# Patient Record
Sex: Female | Born: 1970
Health system: Southern US, Community
[De-identification: ages and names within clinical notes are randomized; demographics above are authoritative.]

---

## 1998-06-21 ENCOUNTER — Observation Stay (HOSPITAL_COMMUNITY): Admission: EM | Admit: 1998-06-21 | Discharge: 1998-06-22 | Payer: Self-pay | Admitting: Emergency Medicine

## 1999-03-26 ENCOUNTER — Emergency Department (HOSPITAL_COMMUNITY): Admission: EM | Admit: 1999-03-26 | Discharge: 1999-03-26 | Payer: Self-pay | Admitting: Emergency Medicine

## 1999-03-26 ENCOUNTER — Encounter: Payer: Self-pay | Admitting: Emergency Medicine

## 1999-03-31 ENCOUNTER — Encounter: Payer: Self-pay | Admitting: Orthopedic Surgery

## 1999-03-31 ENCOUNTER — Ambulatory Visit (HOSPITAL_COMMUNITY): Admission: RE | Admit: 1999-03-31 | Discharge: 1999-03-31 | Payer: Self-pay | Admitting: Orthopedic Surgery

## 2002-11-16 ENCOUNTER — Emergency Department (HOSPITAL_COMMUNITY): Admission: EM | Admit: 2002-11-16 | Discharge: 2002-11-16 | Payer: Self-pay | Admitting: Emergency Medicine

## 2003-09-26 ENCOUNTER — Emergency Department (HOSPITAL_COMMUNITY): Admission: AD | Admit: 2003-09-26 | Discharge: 2003-09-26 | Payer: Self-pay | Admitting: Family Medicine

## 2004-01-01 ENCOUNTER — Emergency Department (HOSPITAL_COMMUNITY): Admission: AD | Admit: 2004-01-01 | Discharge: 2004-01-01 | Payer: Self-pay | Admitting: Family Medicine

## 2004-01-05 ENCOUNTER — Ambulatory Visit (HOSPITAL_COMMUNITY): Admission: RE | Admit: 2004-01-05 | Discharge: 2004-01-05 | Payer: Self-pay | Admitting: Chiropractic Medicine

## 2004-05-26 ENCOUNTER — Emergency Department (HOSPITAL_COMMUNITY): Admission: EM | Admit: 2004-05-26 | Discharge: 2004-05-26 | Payer: Self-pay | Admitting: Family Medicine

## 2005-03-20 ENCOUNTER — Other Ambulatory Visit: Admission: RE | Admit: 2005-03-20 | Discharge: 2005-03-20 | Payer: Self-pay | Admitting: Obstetrics and Gynecology

## 2006-08-29 ENCOUNTER — Emergency Department (HOSPITAL_COMMUNITY): Admission: EM | Admit: 2006-08-29 | Discharge: 2006-08-29 | Payer: Self-pay | Admitting: Emergency Medicine

## 2008-04-20 ENCOUNTER — Emergency Department (HOSPITAL_COMMUNITY): Admission: EM | Admit: 2008-04-20 | Discharge: 2008-04-20 | Payer: Self-pay | Admitting: Family Medicine

## 2008-04-23 ENCOUNTER — Ambulatory Visit: Payer: Self-pay | Admitting: Family Medicine

## 2009-04-07 ENCOUNTER — Emergency Department (HOSPITAL_COMMUNITY): Admission: EM | Admit: 2009-04-07 | Discharge: 2009-04-07 | Payer: Self-pay | Admitting: Emergency Medicine

## 2009-12-08 ENCOUNTER — Ambulatory Visit (HOSPITAL_COMMUNITY): Admission: RE | Admit: 2009-12-08 | Discharge: 2009-12-08 | Payer: Self-pay | Admitting: Obstetrics & Gynecology

## 2010-10-02 ENCOUNTER — Emergency Department (HOSPITAL_COMMUNITY)
Admission: EM | Admit: 2010-10-02 | Discharge: 2010-10-02 | Payer: Self-pay | Source: Home / Self Care | Admitting: Family Medicine

## 2010-11-01 ENCOUNTER — Emergency Department (HOSPITAL_COMMUNITY)
Admission: EM | Admit: 2010-11-01 | Discharge: 2010-11-01 | Payer: Self-pay | Source: Home / Self Care | Admitting: Emergency Medicine

## 2010-11-19 ENCOUNTER — Encounter: Payer: Self-pay | Admitting: Obstetrics & Gynecology

## 2011-01-19 ENCOUNTER — Other Ambulatory Visit (HOSPITAL_COMMUNITY): Payer: Self-pay | Admitting: Obstetrics & Gynecology

## 2011-01-19 DIAGNOSIS — Z1231 Encounter for screening mammogram for malignant neoplasm of breast: Secondary | ICD-10-CM

## 2011-01-25 ENCOUNTER — Ambulatory Visit (HOSPITAL_COMMUNITY)
Admission: RE | Admit: 2011-01-25 | Discharge: 2011-01-25 | Disposition: A | Discharge status: Home / Self Care | Payer: Commercial Managed Care - PPO | Source: Ambulatory Visit | Attending: Obstetrics & Gynecology | Admitting: Obstetrics & Gynecology

## 2011-01-25 DIAGNOSIS — Z1231 Encounter for screening mammogram for malignant neoplasm of breast: Secondary | ICD-10-CM

## 2011-02-05 LAB — POCT I-STAT, CHEM 8
Calcium, Ion: 1.09 mmol/L — ABNORMAL LOW (ref 1.12–1.32)
Chloride: 105 mEq/L (ref 96–112)
Glucose, Bld: 80 mg/dL (ref 70–99)
HCT: 41 % (ref 36.0–46.0)
Hemoglobin: 13.9 g/dL (ref 12.0–15.0)
TCO2: 21 mmol/L (ref 0–100)

## 2011-02-15 ENCOUNTER — Inpatient Hospital Stay (INDEPENDENT_AMBULATORY_CARE_PROVIDER_SITE_OTHER)
Admission: RE | Admit: 2011-02-15 | Discharge: 2011-02-15 | Disposition: A | Discharge status: Home / Self Care | Payer: Commercial Managed Care - PPO | Source: Ambulatory Visit

## 2011-02-15 ENCOUNTER — Ambulatory Visit (INDEPENDENT_AMBULATORY_CARE_PROVIDER_SITE_OTHER): Payer: Commercial Managed Care - PPO

## 2011-02-15 DIAGNOSIS — R071 Chest pain on breathing: Secondary | ICD-10-CM

## 2011-02-15 DIAGNOSIS — K219 Gastro-esophageal reflux disease without esophagitis: Secondary | ICD-10-CM

## 2011-02-15 LAB — POCT I-STAT, CHEM 8
BUN: 11 mg/dL (ref 6–23)
Calcium, Ion: 1.22 mmol/L (ref 1.12–1.32)
Hemoglobin: 15.3 g/dL — ABNORMAL HIGH (ref 12.0–15.0)
TCO2: 24 mmol/L (ref 0–100)

## 2011-02-15 LAB — DIFFERENTIAL
Basophils Relative: 1 % (ref 0–1)
Eosinophils Absolute: 0.2 10*3/uL (ref 0.0–0.7)
Monocytes Absolute: 0.5 10*3/uL (ref 0.1–1.0)
Neutrophils Relative %: 50 % (ref 43–77)

## 2011-02-15 LAB — CBC
HCT: 39.3 % (ref 36.0–46.0)
Hemoglobin: 13.2 g/dL (ref 12.0–15.0)
MCV: 67.2 fL — ABNORMAL LOW (ref 78.0–100.0)
RBC: 5.85 MIL/uL — ABNORMAL HIGH (ref 3.87–5.11)
WBC: 4.4 10*3/uL (ref 4.0–10.5)

## 2011-03-13 ENCOUNTER — Ambulatory Visit (HOSPITAL_COMMUNITY)
Admission: RE | Admit: 2011-03-13 | Discharge: 2011-03-13 | Disposition: A | Discharge status: Home / Self Care | Payer: Commercial Managed Care - PPO | Attending: Psychiatry | Admitting: Psychiatry

## 2011-03-13 DIAGNOSIS — F39 Unspecified mood [affective] disorder: Secondary | ICD-10-CM | POA: Insufficient documentation

## 2011-03-19 ENCOUNTER — Other Ambulatory Visit (HOSPITAL_COMMUNITY): Payer: 59 | Admitting: Psychiatry

## 2011-03-20 ENCOUNTER — Other Ambulatory Visit (HOSPITAL_COMMUNITY): Payer: 59 | Attending: Psychiatry | Admitting: Psychiatry

## 2011-03-20 DIAGNOSIS — F39 Unspecified mood [affective] disorder: Secondary | ICD-10-CM | POA: Insufficient documentation

## 2011-03-21 ENCOUNTER — Other Ambulatory Visit (HOSPITAL_COMMUNITY): Payer: 59 | Admitting: Psychiatry

## 2011-03-22 ENCOUNTER — Other Ambulatory Visit (HOSPITAL_COMMUNITY): Payer: 59 | Admitting: Psychiatry

## 2011-03-22 ENCOUNTER — Encounter (HOSPITAL_COMMUNITY): Payer: Commercial Managed Care - PPO | Admitting: Psychology

## 2011-03-23 ENCOUNTER — Other Ambulatory Visit (HOSPITAL_COMMUNITY): Payer: 59 | Admitting: Psychiatry

## 2011-03-27 ENCOUNTER — Other Ambulatory Visit (HOSPITAL_COMMUNITY): Payer: 59 | Admitting: Psychiatry

## 2011-03-27 ENCOUNTER — Other Ambulatory Visit (HOSPITAL_COMMUNITY): Payer: Commercial Managed Care - PPO | Attending: Psychiatry | Admitting: Psychiatry

## 2011-03-27 DIAGNOSIS — F39 Unspecified mood [affective] disorder: Secondary | ICD-10-CM | POA: Insufficient documentation

## 2011-03-28 ENCOUNTER — Other Ambulatory Visit (HOSPITAL_COMMUNITY): Payer: 59 | Admitting: Psychiatry

## 2011-03-29 ENCOUNTER — Other Ambulatory Visit (HOSPITAL_COMMUNITY): Payer: 59 | Admitting: Psychiatry

## 2011-03-30 ENCOUNTER — Other Ambulatory Visit (HOSPITAL_COMMUNITY): Payer: Commercial Managed Care - PPO | Attending: Psychiatry | Admitting: Psychiatry

## 2011-03-30 DIAGNOSIS — F319 Bipolar disorder, unspecified: Secondary | ICD-10-CM | POA: Insufficient documentation

## 2011-03-30 DIAGNOSIS — IMO0002 Reserved for concepts with insufficient information to code with codable children: Secondary | ICD-10-CM | POA: Insufficient documentation

## 2011-03-30 DIAGNOSIS — Z6379 Other stressful life events affecting family and household: Secondary | ICD-10-CM | POA: Insufficient documentation

## 2011-03-30 DIAGNOSIS — F39 Unspecified mood [affective] disorder: Secondary | ICD-10-CM | POA: Insufficient documentation

## 2011-04-02 ENCOUNTER — Other Ambulatory Visit (HOSPITAL_COMMUNITY): Payer: Commercial Managed Care - PPO | Admitting: Psychiatry

## 2011-04-03 ENCOUNTER — Other Ambulatory Visit (HOSPITAL_COMMUNITY): Payer: Commercial Managed Care - PPO | Admitting: Psychiatry

## 2011-04-04 ENCOUNTER — Other Ambulatory Visit (HOSPITAL_COMMUNITY): Payer: Commercial Managed Care - PPO | Admitting: Psychiatry

## 2011-04-05 ENCOUNTER — Other Ambulatory Visit (HOSPITAL_COMMUNITY): Payer: Commercial Managed Care - PPO | Admitting: Psychiatry

## 2011-04-06 ENCOUNTER — Other Ambulatory Visit (HOSPITAL_COMMUNITY): Payer: Commercial Managed Care - PPO | Admitting: Psychiatry

## 2011-04-09 ENCOUNTER — Other Ambulatory Visit (HOSPITAL_COMMUNITY): Payer: Commercial Managed Care - PPO | Attending: Psychiatry | Admitting: Psychiatry

## 2011-04-09 DIAGNOSIS — F319 Bipolar disorder, unspecified: Secondary | ICD-10-CM | POA: Insufficient documentation

## 2011-04-09 DIAGNOSIS — F39 Unspecified mood [affective] disorder: Secondary | ICD-10-CM | POA: Insufficient documentation

## 2011-04-09 DIAGNOSIS — Z6379 Other stressful life events affecting family and household: Secondary | ICD-10-CM | POA: Insufficient documentation

## 2011-04-09 DIAGNOSIS — IMO0002 Reserved for concepts with insufficient information to code with codable children: Secondary | ICD-10-CM | POA: Insufficient documentation

## 2011-04-10 ENCOUNTER — Other Ambulatory Visit (HOSPITAL_COMMUNITY): Payer: Commercial Managed Care - PPO | Admitting: Psychiatry

## 2011-04-11 ENCOUNTER — Other Ambulatory Visit (HOSPITAL_COMMUNITY): Payer: Commercial Managed Care - PPO | Admitting: Psychiatry

## 2011-04-12 ENCOUNTER — Other Ambulatory Visit (HOSPITAL_COMMUNITY): Payer: Commercial Managed Care - PPO | Admitting: Psychiatry

## 2011-05-24 ENCOUNTER — Inpatient Hospital Stay (INDEPENDENT_AMBULATORY_CARE_PROVIDER_SITE_OTHER)
Admission: RE | Admit: 2011-05-24 | Discharge: 2011-05-24 | Disposition: A | Discharge status: Home / Self Care | Payer: Commercial Managed Care - PPO | Source: Ambulatory Visit | Attending: Family Medicine | Admitting: Family Medicine

## 2011-05-24 DIAGNOSIS — N390 Urinary tract infection, site not specified: Secondary | ICD-10-CM

## 2011-05-24 DIAGNOSIS — I10 Essential (primary) hypertension: Secondary | ICD-10-CM

## 2011-05-24 LAB — POCT URINALYSIS DIP (DEVICE)
Bilirubin Urine: NEGATIVE
Ketones, ur: NEGATIVE mg/dL
Protein, ur: NEGATIVE mg/dL
Specific Gravity, Urine: 1.015 (ref 1.005–1.030)
pH: 7 (ref 5.0–8.0)

## 2011-07-26 LAB — POCT CARDIAC MARKERS
CKMB, poc: <1 — ABNORMAL LOW
Myoglobin, poc: 29.5
Operator id: 284251
Operator id: 284251
Troponin i, poc: <0.05

## 2011-07-26 LAB — CBC
HCT: 35.9 — ABNORMAL LOW
Hemoglobin: 11.8 — ABNORMAL LOW
MCV: 70.2 — ABNORMAL LOW
RBC: 5.12 — ABNORMAL HIGH
WBC: 8.2

## 2011-07-26 LAB — POCT I-STAT, CHEM 8
BUN: 12
Potassium: 3.6
Sodium: 138
TCO2: 22

## 2011-12-19 ENCOUNTER — Other Ambulatory Visit (HOSPITAL_COMMUNITY): Payer: Self-pay | Admitting: Obstetrics & Gynecology

## 2011-12-19 DIAGNOSIS — Z1231 Encounter for screening mammogram for malignant neoplasm of breast: Secondary | ICD-10-CM

## 2012-01-21 ENCOUNTER — Ambulatory Visit (HOSPITAL_COMMUNITY)
Admission: RE | Admit: 2012-01-21 | Discharge: 2012-01-21 | Disposition: A | Discharge status: Home / Self Care | Payer: 59 | Source: Ambulatory Visit | Attending: Obstetrics & Gynecology | Admitting: Obstetrics & Gynecology

## 2012-01-21 DIAGNOSIS — Z1231 Encounter for screening mammogram for malignant neoplasm of breast: Secondary | ICD-10-CM | POA: Insufficient documentation

## 2012-04-22 ENCOUNTER — Encounter (HOSPITAL_COMMUNITY): Payer: Self-pay

## 2012-04-22 ENCOUNTER — Emergency Department (HOSPITAL_COMMUNITY)
Admission: EM | Admit: 2012-04-22 | Discharge: 2012-04-22 | Disposition: A | Discharge status: Nursing Home (Includes ICF) | Payer: 59 | Source: Home / Self Care | Attending: Family Medicine | Admitting: Family Medicine

## 2012-04-22 ENCOUNTER — Encounter (HOSPITAL_COMMUNITY): Payer: Self-pay | Admitting: Adult Health

## 2012-04-22 DIAGNOSIS — R001 Bradycardia, unspecified: Secondary | ICD-10-CM

## 2012-04-22 DIAGNOSIS — R51 Headache: Secondary | ICD-10-CM

## 2012-04-22 DIAGNOSIS — R600 Localized edema: Secondary | ICD-10-CM

## 2012-04-22 DIAGNOSIS — R110 Nausea: Secondary | ICD-10-CM

## 2012-04-22 DIAGNOSIS — R609 Edema, unspecified: Secondary | ICD-10-CM

## 2012-04-22 DIAGNOSIS — I498 Other specified cardiac arrhythmias: Secondary | ICD-10-CM

## 2012-04-22 DIAGNOSIS — Z0389 Encounter for observation for other suspected diseases and conditions ruled out: Secondary | ICD-10-CM | POA: Insufficient documentation

## 2012-04-22 LAB — POCT URINALYSIS DIP (DEVICE)
Leukocytes, UA: NEGATIVE
Nitrite: NEGATIVE
Protein, ur: NEGATIVE mg/dL
pH: 6.5 (ref 5.0–8.0)

## 2012-04-22 LAB — POCT I-STAT, CHEM 8
Creatinine, Ser: 1 mg/dL (ref 0.50–1.10)
Glucose, Bld: 92 mg/dL (ref 70–99)
Hemoglobin: 13.6 g/dL (ref 12.0–15.0)
Sodium: 140 mEq/L (ref 135–145)
TCO2: 26 mmol/L (ref 0–100)

## 2012-04-22 MED ORDER — ONDANSETRON 4 MG PO TBDP
ORAL_TABLET | ORAL | Status: AC
  Filled 2012-04-22: qty 1

## 2012-04-22 MED ORDER — ONDANSETRON 4 MG PO TBDP
4.0000 mg | ORAL_TABLET | Freq: Once | ORAL | Status: AC
  Administered 2012-04-22: 4 mg via ORAL

## 2012-04-22 NOTE — ED Notes (Addendum)
Pt not in lobby when called for recheck vital signs

## 2012-04-22 NOTE — ED Notes (Signed)
States has had headache all day.  States BP was elevated earlier today 140/100 and 170/100 and her legs and hands are edematous.  Took 1 tylenol at 6 pm for pain without relief.  States she has vomited x 3 since 6 pm.

## 2012-04-22 NOTE — ED Notes (Signed)
Reports headache. Bilateral leg swelling and cough that began yesterday. Sent over from [facility], Hypertensive 150/100, bilateral +1 pitting lower extremitiy edema and EKG from [facility] that shows sinus bradycardia. Pt denies Chest c/o 10/10 headache. Denies SOB. Alert and oriented, MAEx4, answers questions appropriately.

## 2012-04-22 NOTE — ED Provider Notes (Signed)
History     CSN: [id number]  Arrival date & time 04/22/12  1842   First MD Initiated Contact with Patient 04/22/12 1907      Chief Complaint  Patient presents with  . Headache  . Hypertension    (Consider location/radiation/quality/duration/timing/severity/associated sxs/prior treatment) HPI Comments: 41 year old female with history of depression otherwise healthy. Here complaining of headache associated with nausea and photophobia since morning today. Status she felt better work and have her blood pressure checked and he was 140/100 later 170/100. She has noted swelling in her ankles and distal lower legs since yesterday. Had 3 episodes of vomiting food content today. No prior history of migraines. Was treated recently for a UTI with Cipro and for dental infection with penicillin she completed both treatments. Denies dysuria or hematuria. Denies chest pain or shortness of breath. No PND. Last bowel movement earlier this morning soft brown no melena. 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 Denies fever or chills. No rash. No balance problems. No face drop. No difficulty moving arms or legs. No problems understanding or making speech. No weakness, paresthesias or numbness. Took Tylenol 500 mg at 1 PM today. Denies illicit drug intake other than "few puffs marijuana yesterday"  Patient is a 41 y.o. female presenting with hypertension.  Hypertension Associated symptoms include headaches. Pertinent negatives include no chest pain, no abdominal pain and no shortness of breath.    History reviewed. No pertinent past medical history.  History reviewed. No pertinent past surgical history.  No family history on file.  History  Substance Use Topics  . Smoking status: Never Smoker   . Smokeless tobacco: Not on file  . Alcohol Use: Yes    OB History    Grav Para Term Preterm Abortions TAB SAB Ect Mult  Living                  Review of Systems  Constitutional: Negative for fever and chills.  HENT: Negative for sore throat, neck pain and neck stiffness.   Respiratory: Negative for chest tightness and shortness of breath.   Cardiovascular: Positive for leg swelling. Negative for chest pain and palpitations.  Gastrointestinal: Positive for nausea and vomiting. Negative for abdominal pain, diarrhea and constipation.  Genitourinary: Negative for dysuria and flank pain.  Skin: Negative for rash.  Neurological: Positive for headaches. Negative for dizziness, tremors, seizures, syncope, speech difficulty, weakness and numbness.    Allergies  Review of patient's allergies indicates no known allergies.  Home Medications   Current Outpatient Rx  Name Route Sig Dispense Refill  . PENICILLIN V POTASSIUM PO Oral Take by mouth.      BP 137/84  Pulse 76  Temp 98.6 F (37 C) (Oral)  Resp 20  SpO2 99%  LMP 03/29/2012  Physical Exam  Nursing note and vitals reviewed. Constitutional: She is oriented to person, place, and time. She appears well-developed and well-nourished. No distress.  HENT:  Head: Normocephalic and atraumatic.  Right Ear: External ear normal.  Left Ear: External ear normal.  Nose: Nose normal.  Mouth/Throat: Oropharynx is clear and moist. No oropharyngeal exudate.  Eyes: Conjunctivae and EOM are normal. Pupils are equal, round, and reactive to light. Right eye exhibits no discharge. Left eye exhibits no discharge. No scleral icterus.  Neck: Normal range of motion. Neck supple. No JVD present.  Cardiovascular: Normal rate, regular rhythm and  normal heart sounds.  Exam reveals no gallop and no friction rub.   No murmur heard. Pulmonary/Chest: Effort normal and breath sounds normal. No respiratory distress. She has no wheezes. She has no rales.  Abdominal: Soft. Bowel sounds are normal. She exhibits no distension. There is no tenderness. There is no rebound and no  guarding.  Lymphadenopathy:    She has no cervical adenopathy.  Neurological: She is alert and oriented to person, place, and time. She has normal strength and normal reflexes. No cranial nerve deficit or sensory deficit. She displays a negative Romberg sign. Coordination and gait normal.       Photophobic and uncomfortable with headache.  Skin: No rash noted.    ED Course  Procedures (including critical care time)   Labs Reviewed  POCT I-STAT, CHEM 8  POCT URINALYSIS DIP (DEVICE)  POCT PREGNANCY, URINE   No results found.   1. Headache   2. Nausea   3. Bilateral lower extremity edema   4. Bradycardia by electrocardiogram       MDM  41 year old female with history of depression otherwise no significant past medical history. Here with persistent worsening headache associated with nausea and vomiting for 3 times a day. Also bilateral low extremity edema for one day. Recent antibiotic treatment with Cipro for UTI and dental abscess still taking penicillin. Was found to be hypertensive at work with blood pressure in the 140s-170 over 100s. On exam afebrile, patient is photophobic and uncomfortable due to headache otherwise no neurological deficit. Normal lung and heart exam with no murmurs or gallops . No acute ischemic changes. Normal point-of-care  Glucose, hemoglobin, electrolytes and creatinine. Normal point-of-care urinalysis.  EKG with sinus rhythm, rate 50. No acute ischemic changes. No fascicular or AV blocks. Vital signs stable No hypertensive here. Concerned for possible symptomatic bradycardia of no obvious underlying etiology. Decided to transfer to the emergency department for further evaluation and management .   Sharin Grave, MD 04/22/12 2121

## 2012-04-23 ENCOUNTER — Emergency Department (HOSPITAL_COMMUNITY)
Admission: EM | Admit: 2012-04-23 | Discharge: 2012-04-23 | Disposition: A | Discharge status: Home / Self Care | Payer: 59 | Attending: Emergency Medicine | Admitting: Emergency Medicine

## 2012-04-23 NOTE — ED Notes (Signed)
No answer in triage or in waiting.  Patient LWBS

## 2013-01-28 ENCOUNTER — Other Ambulatory Visit (HOSPITAL_COMMUNITY): Payer: Self-pay | Admitting: Obstetrics & Gynecology

## 2013-01-28 DIAGNOSIS — Z1231 Encounter for screening mammogram for malignant neoplasm of breast: Secondary | ICD-10-CM

## 2013-02-04 ENCOUNTER — Ambulatory Visit (HOSPITAL_COMMUNITY)
Admission: RE | Admit: 2013-02-04 | Discharge: 2013-02-04 | Disposition: A | Discharge status: Home / Self Care | Payer: 59 | Source: Ambulatory Visit | Attending: Obstetrics & Gynecology | Admitting: Obstetrics & Gynecology

## 2013-02-04 ENCOUNTER — Encounter: Payer: Self-pay | Admitting: Internal Medicine

## 2013-02-04 DIAGNOSIS — Z1231 Encounter for screening mammogram for malignant neoplasm of breast: Secondary | ICD-10-CM | POA: Insufficient documentation

## 2013-02-18 ENCOUNTER — Emergency Department (INDEPENDENT_AMBULATORY_CARE_PROVIDER_SITE_OTHER)
Admission: EM | Admit: 2013-02-18 | Discharge: 2013-02-18 | Disposition: A | Discharge status: Home / Self Care | Payer: 59 | Source: Home / Self Care | Attending: Family Medicine | Admitting: Family Medicine

## 2013-02-18 ENCOUNTER — Encounter (HOSPITAL_COMMUNITY): Payer: Self-pay | Admitting: *Deleted

## 2013-02-18 DIAGNOSIS — R339 Retention of urine, unspecified: Secondary | ICD-10-CM

## 2013-02-18 LAB — POCT URINALYSIS DIP (DEVICE)
Bilirubin Urine: NEGATIVE
Ketones, ur: NEGATIVE mg/dL
Leukocytes, UA: NEGATIVE
Protein, ur: 30 mg/dL — AB

## 2013-02-18 LAB — POCT PREGNANCY, URINE: Preg Test, Ur: NEGATIVE

## 2013-02-18 MED ORDER — TOLTERODINE TARTRATE ER 4 MG PO CP24: 4.0000 mg | ORAL_CAPSULE | Freq: Every day | ORAL | Status: AC

## 2013-02-18 NOTE — ED Provider Notes (Signed)
History     CSN: [id number]  Arrival date & time 02/18/13  1457   First MD Initiated Contact with Patient 02/18/13 1515      Chief Complaint  Patient presents with  . Urinary Retention    (Consider location/radiation/quality/duration/timing/severity/associated sxs/prior treatment) Patient is a 42 y.o. female presenting with abdominal pain. The history is provided by the patient.  Abdominal Pain Pain location:  Suprapubic Pain quality: burning   Pain radiates to:  Does not radiate Pain severity:  Mild Duration:  2 days Progression:  Worsening Chronicity:  New Context comment:  Urinary retention for 8h today., no prior problem. Associated symptoms: nausea   Associated symptoms: no dysuria and no hematuria     History reviewed. No pertinent past medical history.  Past Surgical History  Procedure Laterality Date  . Cesarean section  1998, 1992     x 2    Family History  Problem Relation Age of Onset  . Breast cancer Mother   . HIV Father     History  Substance Use Topics  . Smoking status: Current Some Day Smoker    Types: Cigars  . Smokeless tobacco: Not on file  . Alcohol Use: No    OB History   Grav Para Term Preterm Abortions TAB SAB Ect Mult Living                  Review of Systems  Constitutional: Negative.   HENT: Negative.   Gastrointestinal: Positive for nausea and abdominal pain.  Genitourinary: Positive for difficulty urinating. Negative for dysuria, frequency, hematuria, flank pain, enuresis and menstrual problem.    Allergies  Review of patient's allergies indicates no known allergies.  Home Medications   Current Outpatient Rx  Name  Route  Sig  Dispense  Refill  . PENICILLIN V POTASSIUM PO   Oral   Take by mouth.           BP 129/70  Pulse 82  Temp(Src) 98.4 F (36.9 C) (Oral)  Resp 16  SpO2 99%  LMP 01/04/2013  Physical Exam  Nursing note and vitals reviewed. Constitutional: She is oriented to person, place, and  time. She appears well-developed and well-nourished.  HENT:  Mouth/Throat: Oropharynx is clear and moist.  Neck: Normal range of motion. Neck supple.  Pulmonary/Chest: Breath sounds normal.  Abdominal: Soft. Bowel sounds are normal. She exhibits no distension and no mass. There is tenderness. There is no rebound and no guarding.  Neurological: She is alert and oriented to person, place, and time.  Skin: Skin is warm and dry.    ED Course  Procedures (including critical care time)  Labs Reviewed  POCT URINALYSIS DIP (DEVICE) - Abnormal; Notable for the following:    Protein, ur 30 (*)    All other components within normal limits  POCT PREGNANCY, URINE   No results found.   1. Urinary retention       MDM          Linna Hoff, MD 02/18/13 1655

## 2013-02-18 NOTE — ED Notes (Signed)
Has not urinated for 8 hrs today.  C/o nausea and gagging and lower abdominal pain x 2 days.

## 2013-03-09 ENCOUNTER — Ambulatory Visit: Payer: 59 | Admitting: Internal Medicine

## 2013-09-14 ENCOUNTER — Other Ambulatory Visit: Payer: Self-pay | Admitting: Family

## 2013-09-14 DIAGNOSIS — M79604 Pain in right leg: Secondary | ICD-10-CM

## 2013-09-15 ENCOUNTER — Ambulatory Visit
Admission: RE | Admit: 2013-09-15 | Discharge: 2013-09-15 | Disposition: A | Discharge status: Home / Self Care | Payer: 59 | Source: Ambulatory Visit | Attending: Family | Admitting: Family

## 2013-09-15 DIAGNOSIS — M79604 Pain in right leg: Secondary | ICD-10-CM

## 2013-12-23 ENCOUNTER — Emergency Department (HOSPITAL_COMMUNITY)
Admission: EM | Admit: 2013-12-23 | Discharge: 2013-12-23 | Disposition: A | Discharge status: Home / Self Care | Payer: 59 | Source: Home / Self Care | Attending: Family Medicine | Admitting: Family Medicine

## 2013-12-23 ENCOUNTER — Encounter (HOSPITAL_COMMUNITY): Payer: Self-pay | Admitting: Emergency Medicine

## 2013-12-23 DIAGNOSIS — T162XXA Foreign body in left ear, initial encounter: Secondary | ICD-10-CM

## 2013-12-23 DIAGNOSIS — T169XXA Foreign body in ear, unspecified ear, initial encounter: Secondary | ICD-10-CM

## 2013-12-23 NOTE — ED Notes (Signed)
C/o foreign body in left ear for two weeks States she has been dizzy States she had a provider look in ear States she is missing the back of her ear.

## 2013-12-23 NOTE — ED Provider Notes (Signed)
Laura Todd is a 43 y.o. female who presents to Urgent Care today for  Foreign body left ear present for 2 weeks. Patient fell asleep with her earrings in. They brought her back into hearing fell into her left ear canal and has been there for 2 weeks. This is uncomfortable. A resident physician attempted to remove the foreign body however they were unsuccessful.    History reviewed. No pertinent past medical history. History  Substance Use Topics  . Smoking status: Current Some Day Smoker    Types: Cigars  . Smokeless tobacco: Not on file  . Alcohol Use: No   ROS as above Medications: No current facility-administered medications for this encounter.   Current Outpatient Prescriptions  Medication Sig Dispense Refill  . PENICILLIN V POTASSIUM PO Take by mouth.      . tolterodine (DETROL LA) 4 MG 24 hr capsule Take 1 capsule (4 mg total) by mouth daily.  30 capsule  1    Exam:  BP 130/100  Pulse 65  Temp(Src) 98.6 F (37 C) (Oral)  Resp 18  SpO2 100%  LMP 12/18/2013 Gen: Well NAD HEENT: EOMI,  MMM. Left ear canal with clear small rubber piece normal otherwise. Right is normal   Long forceps are used to attempt to grab the rubber back however this was unsuccessful.  A wooden Q-tip with Dermabond was used to attempt to remove however this was unsuccessful however the orientation of the foreign body was changed with this attempt. The forceps were again used this time successfully to remove the foreign body. Patient felt immediately better after removal of the foreign body.   Assessment and Plan: 43 y.o. female with left ear foreign body status post removal. Followup as needed.  Discussed warning signs or symptoms. Please see discharge instructions. Patient expresses understanding.    Rodolph BongEvan S Markeesha Char, MD 12/23/13 1800

## 2013-12-23 NOTE — Discharge Instructions (Signed)
Thank you for coming in today. Come back as needed  Ear Foreign Body An ear foreign body is an object that is stuck in the ear. It is common for young children to put objects into the ear canal. These may include pebbles, beads, beans, and any other small objects which will fit. In adults, objects such as cotton swabs may become lodged in the ear canal. In all ages, the most common foreign bodies are insects that enter the ear canal.  SYMPTOMS  Foreign bodies may cause pain, buzzing or roaring sounds, hearing loss, and ear drainage.  HOME CARE INSTRUCTIONS   Keep all follow-up appointments with your caregiver as told.  Keep small objects out of reach of young children. Tell them not to put anything in their ears. SEEK IMMEDIATE MEDICAL CARE IF:   You have bleeding from the ear.  You have increased pain or swelling of the ear.  You have reduced hearing.  You have discharge coming from the ear.  You have a fever.  You have a headache. MAKE SURE YOU:   Understand these instructions.  Will watch your condition.  Will get help right away if you are not doing well or get worse. Document Released: 10/12/2000 Document Revised: 01/07/2012 Document Reviewed: 06/02/2008 [facility] Patient Information 2014 [place], [place].

## 2014-02-16 ENCOUNTER — Other Ambulatory Visit (HOSPITAL_COMMUNITY): Payer: Self-pay | Admitting: Internal Medicine

## 2014-02-24 ENCOUNTER — Other Ambulatory Visit: Payer: Self-pay | Admitting: Internal Medicine

## 2014-02-24 ENCOUNTER — Other Ambulatory Visit: Payer: Self-pay | Admitting: Family

## 2014-02-24 ENCOUNTER — Ambulatory Visit
Admission: RE | Admit: 2014-02-24 | Discharge: 2014-02-24 | Disposition: A | Discharge status: Home / Self Care | Payer: 59 | Source: Ambulatory Visit | Attending: Family | Admitting: Family

## 2014-02-24 DIAGNOSIS — R0789 Other chest pain: Secondary | ICD-10-CM

## 2014-02-24 DIAGNOSIS — N644 Mastodynia: Secondary | ICD-10-CM

## 2014-02-24 DIAGNOSIS — N6310 Unspecified lump in the right breast, unspecified quadrant: Secondary | ICD-10-CM

## 2014-03-05 ENCOUNTER — Other Ambulatory Visit: Payer: 59

## 2014-10-29 ENCOUNTER — Encounter (HOSPITAL_COMMUNITY): Payer: Self-pay | Admitting: Emergency Medicine

## 2014-10-29 ENCOUNTER — Emergency Department (HOSPITAL_COMMUNITY)
Admission: EM | Admit: 2014-10-29 | Discharge: 2014-10-29 | Disposition: A | Discharge status: Home / Self Care | Payer: 59 | Attending: Emergency Medicine | Admitting: Emergency Medicine

## 2014-10-29 DIAGNOSIS — Z720 Tobacco use: Secondary | ICD-10-CM | POA: Insufficient documentation

## 2014-10-29 DIAGNOSIS — I82811 Embolism and thrombosis of superficial veins of right lower extremities: Secondary | ICD-10-CM | POA: Insufficient documentation

## 2014-10-29 DIAGNOSIS — Z792 Long term (current) use of antibiotics: Secondary | ICD-10-CM | POA: Diagnosis not present

## 2014-10-29 DIAGNOSIS — Z79899 Other long term (current) drug therapy: Secondary | ICD-10-CM | POA: Insufficient documentation

## 2014-10-29 DIAGNOSIS — R2241 Localized swelling, mass and lump, right lower limb: Secondary | ICD-10-CM | POA: Diagnosis present

## 2014-10-29 LAB — CBC
HEMATOCRIT: 37 % (ref 36.0–46.0)
Hemoglobin: 12.1 g/dL (ref 12.0–15.0)
MCH: 22.4 pg — AB (ref 26.0–34.0)
MCHC: 32.7 g/dL (ref 30.0–36.0)
MCV: 68.4 fL — AB (ref 78.0–100.0)
Platelets: 275 10*3/uL (ref 150–400)
RBC: 5.41 MIL/uL — AB (ref 3.87–5.11)
RDW: 15.7 % — ABNORMAL HIGH (ref 11.5–15.5)
WBC: 8 10*3/uL (ref 4.0–10.5)

## 2014-10-29 LAB — BASIC METABOLIC PANEL
Anion gap: 7 (ref 5–15)
BUN: 11 mg/dL (ref 6–23)
CALCIUM: 9 mg/dL (ref 8.4–10.5)
CO2: 25 mmol/L (ref 19–32)
Chloride: 105 mEq/L (ref 96–112)
Creatinine, Ser: 0.79 mg/dL (ref 0.50–1.10)
GFR calc Af Amer: >90 mL/min (ref >90)
GLUCOSE: 95 mg/dL (ref 70–99)
POTASSIUM: 3.9 mmol/L (ref 3.5–5.1)
SODIUM: 137 mmol/L (ref 135–145)

## 2014-10-29 LAB — PROTIME-INR
INR: 1.05 (ref 0.00–1.49)
Prothrombin Time: 13.9 seconds (ref 11.6–15.2)

## 2014-10-29 MED ORDER — ONDANSETRON 4 MG PO TBDP
4.0000 mg | ORAL_TABLET | Freq: Once | ORAL | Status: AC
  Administered 2014-10-29: 4 mg via ORAL
  Filled 2014-10-29: qty 1

## 2014-10-29 MED ORDER — HYDROCODONE-ACETAMINOPHEN 5-325 MG PO TABS
2.0000 | ORAL_TABLET | Freq: Once | ORAL | Status: AC
  Administered 2014-10-29: 2 via ORAL
  Filled 2014-10-29: qty 2

## 2014-10-29 NOTE — ED Provider Notes (Signed)
CSN: [id number]     Arrival date & time 10/29/14  0754 History   First MD Initiated Contact with Patient 10/29/14 [phone]     Chief Complaint  Patient presents with  . Leg Swelling     (Consider location/radiation/quality/duration/timing/severity/associated sxs/prior Treatment) HPI   Laura Todd Is a 44 year old female who presents emergency Department with chief complaint of right leg pain and swelling. 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 She was seen by one of the physicians on the floor who recommended she come to the emergency department immediately for evaluation of DVT. She is a chronic daily smoker. Her mother has a history of blood clots. The patient had an injury to the right leg on Thanksgiving day. She states that she hit the leg when she fell and had some bruising around the knee. She iced it and did not seem to have any significant problems. Over Christmas. The patient drove to [place] and back, totaling 16 hours in her car. The patient began developing swelling, heat, and pain in the right leg with significant calf tenderness. On Monday of this past week. This was 4 days ago. She denies any chest pain or shortness of breath. She denies any systemic symptoms such as fevers, chills, nausea, vomiting or myalgias. She does not take any exogenous estrogens.  History reviewed. No pertinent past medical history. Past Surgical History  Procedure Laterality Date  . Cesarean section  1998, 1992     x 2   Family History  Problem Relation Age of Onset  . Breast cancer Mother   . HIV Father    History  Substance Use Topics  . Smoking status: Current Some Day Smoker    Types: Cigars  . Smokeless tobacco: Not on file  . Alcohol Use: No   OB History    No data available     Review of Systems  Ten systems reviewed and are negative for acute change, except as noted in the HPI.    Allergies  Review of patient's allergies indicates no known allergies.  Home Medications   Prior to  Admission medications   Medication Sig Start Date End Date Taking? Authorizing Provider  PENICILLIN V POTASSIUM PO Take by mouth.    Historical Provider, MD  tolterodine (DETROL LA) 4 MG 24 hr capsule Take 1 capsule (4 mg total) by mouth daily. 02/18/13   Linna Hoff, MD   BP 119/90 mmHg  Pulse 88  Resp 16  Ht  (1.676 m)  SpO2 100%  LMP 12/18/2013 Physical Exam  Constitutional: She is oriented to person, place, and time. She appears well-developed and well-nourished. No distress.  HENT:  Head: Normocephalic and atraumatic.  Eyes: Conjunctivae are normal. No scleral icterus.  Neck: Normal range of motion.  Cardiovascular: Normal rate, regular rhythm and normal heart sounds.  Exam reveals no gallop and no friction rub.   No murmur heard. Pulmonary/Chest: Effort normal and breath sounds normal. No respiratory distress.  Abdominal: Soft. Bowel sounds are normal. She exhibits no distension and no mass. There is no tenderness. There is no guarding.  Musculoskeletal:  Right leg unilateral swelling. There is bruising on the medial side of the knee, it is warm, extremely tender to palpation, she has pain and swelling into the quadriceps and hamstring area. Distal pulses are intact.  Neurological: She is alert and oriented to person, place, and time.  Skin: Skin is warm and dry. She is not diaphoretic.    ED Course  Procedures (including critical care time) Labs Review Labs Reviewed - No data to display  Imaging Review No results found.   EKG Interpretation None      MDM   Final diagnoses:  Superficial thrombosis of lower extremity, right    Patient's duplex ultrasound shows superficial thrombus versus hematoma in the greater saphenous medial to the knee. Ultrasound tech discussed result findings with Dr. Fayrene Fearing and myself states she was unable to trace the venous system, past the clot. The patient does not have a deep venous thrombosis. She will be treated with warm  compresses, aspirin, and follow-up with her primary care physician for a repeat ultrasound in 2 weeks to make sure it does not reach the deep venous system. Patient expresses her understanding and agrees with plan of care. She appears safe for discharge at this time.    Arthor Captain, PA-C 10/29/14 1236  Rolland Porter, MD 11/05/14 [phone]

## 2014-10-29 NOTE — ED Notes (Signed)
Pt states she fell about a month ago and hit her Rt leg. PT states started noticing swelling of rt leg yesterday. PT denies any pain just states, "It feels like it burning". Leg is tender to touch and warm. PT able to move all extremities.

## 2014-10-29 NOTE — ED Notes (Signed)
Called vascular advised pt should be seen soon.    Stated unable to give a time.

## 2014-10-29 NOTE — Progress Notes (Signed)
VASCULAR LAB PRELIMINARY  PRELIMINARY  PRELIMINARY  PRELIMINARY  Right lower extremity venous duplex completed.    Preliminary report:  No evidence of DVT.  At the area of interest in the proximal lateral calf, there is a hypoechoic area with surrounding fluid.  Unable to connect this area with the adjacent GSV.  Probable hematoma.  Cannot rule out localized superficial thrombus of a branch of the GSV.  Canda Podgorski, RVT 10/29/2014, 11:14 AM

## 2014-10-29 NOTE — Discharge Instructions (Signed)
Phlebitis Phlebitis is soreness and swelling (inflammation) of a vein. This can occur in your arms, legs, or torso (trunk), as well as deeper inside your body. Phlebitis is usually not serious when it occurs close to the surface of the body. However, it can cause serious problems when it occurs in a vein deeper inside the body. CAUSES  Phlebitis can be triggered by various things, including:   Reduced blood flow through your veins. This can happen with:  Bed rest over a long period.  Long-distance travel.  Injury.  Surgery.  Being overweight (obese) or pregnant.  Having an IV tube put in the vein and getting certain medicines through the vein.  Cancer and cancer treatment.  Use of illegal drugs taken through the vein.  Inflammatory diseases.  Inherited (genetic) diseases that increase the risk of blood clots.  Hormone therapy, such as birth control pills. SIGNS AND SYMPTOMS   Red, tender, swollen, and painful area on your skin. Usually, the area will be long and narrow.  Firmness along the center of the affected area. This can indicate that a blood clot has formed.  Low-grade fever. DIAGNOSIS  A health care provider can usually diagnose phlebitis by examining the affected area and asking about your symptoms. To check for infection or blood clots, your health care provider may order blood tests or an ultrasound exam of the area. Blood tests and your family history may also indicate if you have an underlying genetic disease that causes blood clots. Occasionally, a piece of tissue is taken from the body (biopsy sample) if an unusual cause of phlebitis is suspected. TREATMENT  Treatment will vary depending on the severity of the condition and the area of the body affected. Treatment may include:  Use of a warm compress or heating pad.  Use of compression stockings or bandages.  Anti-inflammatory medicines.  Removal of any IV tube that may be causing the problem.  Medicines  that kill germs (antibiotics) if an infection is present.  Blood-thinning medicines if a blood clot is suspected or present.  In rare cases, surgery may be needed to remove damaged sections of vein. HOME CARE INSTRUCTIONS   Only take over-the-counter or prescription medicines as directed by your health care provider. Take all medicines exactly as prescribed.  Raise (elevate) the affected area above the level of your heart as directed by your health care provider.  Apply a warm compress or heating pad to the affected area as directed by your health care provider. Do not sleep with the heating pad.  Use compression stockings or bandages as directed. These will speed healing and prevent the condition from coming back.  If you are on blood thinners:  Get follow-up blood tests as directed by your health care provider.  Check with your health care provider before using any new medicines.  Carry a medical alert card or wear your medical alert jewelry to show that you are on blood thinners.  For phlebitis in the legs:  Avoid prolonged standing or bed rest.  Keep your legs moving. Raise your legs when sitting or lying.  Do not smoke.  Women, particularly those over the age of 86, should consider the risks and benefits of taking the contraceptive pill. This kind of hormone treatment can increase your risk for blood clots.  Follow up with your health care provider as directed. SEEK MEDICAL CARE IF:   You have unusual bruising or any bleeding problems.  Your swelling or pain in the affected area  is not improving.  You are on anti-inflammatory medicine, and you develop belly (abdominal) pain. SEEK IMMEDIATE MEDICAL CARE IF:   You have a sudden onset of chest pain or difficulty breathing.  You have a fever or persistent symptoms for more than 2-3 days.  You have a fever and your symptoms suddenly get worse. MAKE SURE YOU:  Understand these instructions.  Will watch your  condition.  Will get help right away if you are not doing well or get worse. Document Released: 10/09/2001 Document Revised: 08/05/2013 Document Reviewed: 06/22/2013 [facility] Patient Information 2015 [place], [place]. This information is not intended to replace advice given to you by your health care provider. Make sure you discuss any questions you have with your health care provider.     Venous Thromboembolism Venous thromboembolism (VTE) is a condition where a blood clot (thrombus) develops in the body. A thrombus usually occurs in a deep vein in the leg or pelvis but can occur in an upper extremity. Sometimes pieces of the thrombus can break off from its original place of development and travel through the bloodstream to other parts of the body. When a thrombus breaks off and travels through the bloodstream, it is called an embolism. The embolism can block the blood flow in the blood vessels of other organs. There are two serious types of VTE:  Deep vein thrombosis (DVT). A DVT is a thrombus that usually occurs in a deep vein of the lower legs, pelvis, or in an upper extremity.  Pulmonary embolism (PE). A PE occurs when an embolism has formed and traveled to the lungs. A PE can block or decrease the blood flow in one or both lungs. Venous thromboembolism is a serious health condition that can cause disability or death. It is very important to not ignore symptoms or delay treatment.   CAUSES   A blood clot can form in a vein from different conditions. A blood clot can develop due to:  Blood flow within a vein that is sluggish or very slow.  Medical conditions that make the blood clot easily.  Vein damage. RISK FACTORS Risk factors can increase your risk of developing a blood clot. Risk factors can include:  Smoking.  Obesity.  Age.  Immobility or sedentary lifestyle.  Sitting or standing for long periods of time.  Chronic or long-term bedrest.  Medical or past history of  blood clots.  Family history of blood clots.  Hip, leg, or pelvis injury or trauma.  Major surgery, especially surgery on the hip, knee, or abdomen.  Pregnancy and childbirth.  Birth control pills and hormone replacement therapy.  Medical conditions such as  Peripheral vascular disease (PVD).  Diabetes.  Cancer. SIGNS AND SYMPTOMS  Symptoms of VTE can depend on where the clot is located and if the clot breaks off and travels to another organ. Sometimes, there may be no symptoms.   DVT symptoms can include:  Swelling of the leg or arm, especially on one side.  Warmth and redness of the leg or arm, especially on one side.  Pain in an arm or leg. Leg pain may be more noticeable or worse when standing or walking.  PE symptoms can include:  Shortness of breath.  Coughing.  Coughing up blood or blood-tinged mucus (hemoptysis).  Chest pain or chest pain with deep breaths (pleuritic chest pain).  Apprehension, anxiety, or a feeling of impending doom.  Rapid heartbeat. A PE is a medical emergency. Call your local emergency services (911 in [place]  if you have these symptoms. DIAGNOSIS  A venous thromboembolism is diagnosed by:  Looking at your medical history and risk factors. Your health care provider will perform a physical exam.  Blood tests, including blood work of how your blood clots.  Imaging tests that can detect a blood clot may be ordered. These can include:  Ultrasonography.  Computed Tomography (CT) scan.  Magnetic Resonance Imaging (MRI).  Echocardiography.  Electrocardiography. TREATMENT  Initial treatment: When a venous thromboembolism has been confirmed, initial treatment consists of using blood-thinning (anticoagulant) medicines. Anticoagulant medicines affect how your blood clots and can cause bleeding. Therefore, when you are on anticoagulants, your blood clotting times are monitored by blood tests called prothrombin time (PT) and International  Normalized Ratio (INR). Typically, the anticoagulants are intravenous (IV) heparin and warfarin. IV heparin is normally started right away because it has a rapid onset of action and thins the blood quickly. Warfarin is also started with IV heparin therapy. Warfarin has a slower onset of action and takes longer to work. This overlap of IV heparin and oral warfarin therapy is continued until PT and INR levels are therapeutic. After the PT and INR levels are therapeutic, IV heparin is discontinued and you are maintained on warfarin.  Other treatment options:  Catheter-directed thrombolysis. This is a clot-busting therapy for a DVT in which a small, flexible hollow tube (catheter) is threaded to the blood clot inside the vein. A clot-busting drug (thrombolytic) is then infused through the catheter. The thrombolytic helps to break up the clot in the vein and restore blood flow.  Direct thrombin inhibitor (DTI) medicine. A DTI is an anticoagulant that slows blood clotting. It is given through an IV.  If you cannot take an anticoagulant, a filter called an inferior vena cava filter (IVC filter) can be placed. The IVC filter is placed in a large vein, in either your leg or abdomen. An IVC filter is left in the vein permanently. The IVC filter can help prevent blood clots from going to your lungs.  Surgery. Blood clots may need to be removed surgically if other treatment options are not working or cannot be used. Types of surgery can include:  Thrombectomy.  Embolectomy.  Venous stenting.  Pain medicine (analgesic). Medicine to control pain is given in addition to the above treatment options. Home treatment:  Continued treatment at home consists of taking either warfarin or under-the-skin (subcutaneous) injections of an anticoagulant. HOME CARE INSTRUCTIONS   Take medicines only as directed by your health care provider. Follow the directions carefully.  Warfarin. Most people will continue taking  warfarin. Your health care provider will advise you on the length of treatment (usually 3 to 6 months, sometimes lifelong).  Too much and too little warfarin are both dangerous. Too much warfarin increases the risk of bleeding. Too little warfarin continues to allow the risk for blood clots. While taking warfarin, you will need to have regular blood tests to measure your blood clotting time. These blood tests usually include both the PT and INR tests. The PT and INR results allow your health care provider to adjust your dose of warfarin. The dose can change for many reasons. It is critically important that you take warfarin exactly as prescribed, and that you have your PT and INR levels drawn exactly as directed.  Many foods, especially foods high in vitamin K can interfere with warfarin and affect the PT and INR results. Foods high in vitamin K include spinach, kale, broccoli, cabbage, collard and  turnip greens, Brussels sprouts, peas, cauliflower, seaweed, and parsley as well as beef and pork liver, green tea, and soybean oil. You should eat a consistent amount of foods high in vitamin K. Avoid major changes in your diet, or notify your health care provider before changing your diet. Arrange a visit with a dietitian to answer your questions.  Many medicines can interfere with warfarin and affect the PT and INR results. You must tell your health care provider about any and all medicines you take, including all vitamins and supplements. Be especially cautious with aspirin and anti-inflammatory medicines. Do not take or discontinue any prescribed or over-the-counter medicine except on the advice of your health care provider or pharmacist.  Warfarin can have side effects, such as excessive bruising or bleeding. You will need to hold pressure over cuts for longer than usual. Your health care provider or pharmacist will discuss other potential side effects.  Avoid sports or activities that may cause injury or  bleeding.  Be mindful when shaving, flossing your teeth, or handling sharp objects.  Alcohol can change the body's ability to handle warfarin. It is best to avoid alcoholic drinks or consume only very small amounts while taking warfarin. Notify your health care provider if you change your alcohol intake.  Notify your dentist or other health care providers before procedures.  Activity. Ask your health care provider how soon you can go back to normal activities if you have had a blood clot.  Exercise. It is very important to exercise and stay active to prevent future blood clots. This is especially important while traveling, sitting, or standing for long periods of time. Exercise your legs by walking or by pumping the muscles frequently.  Compression stockings. You may need to wear compression stockings to help prevent a DVT.  Smoking. If you smoke, quit. Ask your health care provider for help with quitting smoking.  Learn as much as you can about VTE. Educating yourself can help prevent VTE from reoccurring.  Wear a medical alert bracelet or carry a medical alert card. PREVENTION   In-hospital prevention:  Activity. Getting out of bed and walking while you are in the hospital can help prevent blood clots.  Medicines may be given to help prevent blood clots.  Sequential compression device (SCD). A SCD can help prevent blood clots in the lower legs. A compression sleeve is wrapped around each of your legs. The tubing of the sleeve is connected to a machine that pumps air into the compression sleeve. The pumping action of the sleeve helps circulate the blood in your legs.  Compression stockings. Compression stockings are tight, elastic stockings that apply pressure to the lower legs and help prevent blood from pooling in the lower legs. Compression stockings are sometimes used with SCDs.  General prevention:  Exercise regularly if you are able.  Avoid sitting or lying in bed for long  periods of time without moving the legs.  Do not smoke. If you smoke, quit. Ask your health care provider for help.  Avoid exposure to smoke.  Maintain a healthy weight.  Women over the age of 27 should consider the risk of blood clots while taking birth control pills or hormone replacement therapy.  Long distance travel along with prolonged sitting and standing can increase the risk of a DVT. Exercise your legs by walking or by pumping your leg muscles every hour. SEEK MEDICAL CARE IF:   You feel faint or dizzy.  You feel rapid or skipped heartbeats.  You feel weaker or more tired than usual.  You feel you are not getting better in the time expected.  You believe you are having side effects from medicine.  You have joint pain.  You have abdominal pain.  You have new or increased bruising. SEEK IMMEDIATE MEDICAL CARE IF:   You vomit bright red blood or your vomit has a coffee ground type appearance.  You have bowel movements that have bright red blood or are dark or tarry in appearance.  You have bleeding from your rectum.  You have pink or bloody urine.  You develop breathing problems such as shortness of breath or pain with breathing.  You are coughing up blood.  You develop warmth, swelling, or redness in an arm or a leg.  You have chest pain.  You have a sudden, unexplained severe headache.  You have a cut that does not stop bleeding after 10 minutes.  You have a nosebleed that does not stop bleeding after 10 minutes. Document Released: 08/12/2009 Document Revised: 03/01/2014 Document Reviewed: 03/26/2012 [facility] Patient Information 2015 [place], [place]. This information is not intended to replace advice given to you by your health care provider. Make sure you discuss any questions you have with your health care provider.

## 2014-12-03 ENCOUNTER — Other Ambulatory Visit (HOSPITAL_COMMUNITY): Payer: Self-pay | Admitting: Orthopaedic Surgery

## 2014-12-03 DIAGNOSIS — M25561 Pain in right knee: Secondary | ICD-10-CM

## 2014-12-23 ENCOUNTER — Encounter (INDEPENDENT_AMBULATORY_CARE_PROVIDER_SITE_OTHER): Payer: Self-pay

## 2014-12-23 ENCOUNTER — Ambulatory Visit (HOSPITAL_COMMUNITY)
Admission: RE | Admit: 2014-12-23 | Discharge: 2014-12-23 | Disposition: A | Discharge status: Home / Self Care | Payer: 59 | Source: Ambulatory Visit | Attending: Orthopaedic Surgery | Admitting: Orthopaedic Surgery

## 2014-12-23 DIAGNOSIS — M25461 Effusion, right knee: Secondary | ICD-10-CM | POA: Insufficient documentation

## 2014-12-23 DIAGNOSIS — M25561 Pain in right knee: Secondary | ICD-10-CM

## 2014-12-23 DIAGNOSIS — M7121 Synovial cyst of popliteal space [Baker], right knee: Secondary | ICD-10-CM | POA: Diagnosis not present

## 2014-12-23 DIAGNOSIS — X58XXXA Exposure to other specified factors, initial encounter: Secondary | ICD-10-CM | POA: Diagnosis not present

## 2014-12-23 DIAGNOSIS — M76891 Other specified enthesopathies of right lower limb, excluding foot: Secondary | ICD-10-CM | POA: Diagnosis not present

## 2014-12-23 DIAGNOSIS — S83411A Sprain of medial collateral ligament of right knee, initial encounter: Secondary | ICD-10-CM | POA: Diagnosis not present

## 2015-01-14 ENCOUNTER — Encounter (HOSPITAL_COMMUNITY): Payer: Self-pay | Admitting: *Deleted

## 2015-01-14 ENCOUNTER — Emergency Department (INDEPENDENT_AMBULATORY_CARE_PROVIDER_SITE_OTHER)
Admission: EM | Admit: 2015-01-14 | Discharge: 2015-01-14 | Disposition: A | Discharge status: Home / Self Care | Payer: 59 | Source: Home / Self Care | Attending: Emergency Medicine | Admitting: Emergency Medicine

## 2015-01-14 DIAGNOSIS — W450XXA Nail entering through skin, initial encounter: Secondary | ICD-10-CM

## 2015-01-14 DIAGNOSIS — Z23 Encounter for immunization: Secondary | ICD-10-CM

## 2015-01-14 DIAGNOSIS — T148 Other injury of unspecified body region: Secondary | ICD-10-CM

## 2015-01-14 MED ORDER — METRONIDAZOLE 500 MG PO TABS: 500.0000 mg | ORAL_TABLET | Freq: Three times a day (TID) | ORAL | Status: DC

## 2015-01-14 MED ORDER — TETANUS-DIPHTH-ACELL PERTUSSIS 5-2.5-18.5 LF-MCG/0.5 IM SUSP
INTRAMUSCULAR | Status: AC
  Filled 2015-01-14: qty 0.5

## 2015-01-14 MED ORDER — TETANUS-DIPHTH-ACELL PERTUSSIS 5-2.5-18.5 LF-MCG/0.5 IM SUSP
0.5000 mL | Freq: Once | INTRAMUSCULAR | Status: AC
  Administered 2015-01-14: 0.5 mL via INTRAMUSCULAR

## 2015-01-14 NOTE — ED Provider Notes (Signed)
CSN: [id number]     Arrival date & time 01/14/15  0935 History   First MD Initiated Contact with Patient 01/14/15 1035     Chief Complaint  Patient presents with  . Foot Pain   (Consider location/radiation/quality/duration/timing/severity/associated sxs/prior Treatment) HPI  She is a 44 year old woman here for left foot pain. She states about a week ago she stepped on a nail. She has been cleaning the wound and it has healed well. In the last day or so she reports pain in her left leg. She also states that last night she had a muscle spasm in her left leg. She denies any jaw pain or neck stiffness. No difficulty breathing. No weakness. No persistent spasm. She is unsure when she had her last tetanus shot. She has worked for the [employer] [facility] for the last 25 years.  History reviewed. No pertinent past medical history. Past Surgical History  Procedure Laterality Date  . Cesarean section  1998, 1992     x 2   Family History  Problem Relation Age of Onset  . Breast cancer Mother   . HIV Father    History  Substance Use Topics  . Smoking status: Current Some Day Smoker    Types: Cigars  . Smokeless tobacco: Not on file  . Alcohol Use: No   OB History    No data available     Review of Systems As in history of present illness Allergies  Review of patient's allergies indicates no known allergies.  Home Medications   Prior to Admission medications   Medication Sig Start Date End Date Taking? Authorizing Provider  ibuprofen (ADVIL,MOTRIN) 200 MG tablet Take 200 mg by mouth every 6 (six) hours as needed for mild pain or moderate pain.    Historical Provider, MD  metroNIDAZOLE (FLAGYL) 500 MG tablet Take 1 tablet (500 mg total) by mouth 3 (three) times daily. 01/14/15   Charm RingsErin J Honig, MD  tolterodine (DETROL LA) 4 MG 24 hr capsule Take 1 capsule (4 mg total) by mouth daily. Patient not taking: Reported on 10/29/2014 02/18/13   Linna HoffJames D Kindl, MD   BP 145/96 mmHg  Pulse 78   Temp(Src) 98.6 F (37 C) (Oral)  Resp 14  SpO2 100%  LMP 12/18/2013 Physical Exam  Constitutional: She is oriented to person, place, and time. She appears well-developed and well-nourished. No distress.  Cardiovascular: Normal rate.   Pulmonary/Chest: Effort normal.  Neurological: She is alert and oriented to person, place, and time. She exhibits normal muscle tone.  Skin:  Well-healed wound on left foot between first and second toe.    ED Course  Procedures (including critical care time) Labs Review Labs Reviewed - No data to display  Imaging Review No results found.   MDM   1. Puncture wound of skin from metal nail    TDaP given.  Will treat with a course of metronidazole. Return precautions reviewed as in after visit summary.    Charm RingsErin J Honig, MD 01/14/15 1049

## 2015-01-14 NOTE — Discharge Instructions (Signed)
You are at risk for tetanus given the nail injury. We gave you a tetanus booster today. Please take metronidazole 1 pill 3 times a day for the next week.  If you continue to have muscle spasms in the left leg, any difficulty breathing, stiffness of your neck or jaw, weakness in your arms or legs please go to the emergency room right away.

## 2015-01-14 NOTE — ED Notes (Signed)
Pt  Stepped  On     A   Nail    Several     Days  Ago  She  Is  Unsure  As  To  When her  Last  Tetanus  Shot  Was           She  Reports  Some  Pain in the  Affected  Foot

## 2016-05-23 DIAGNOSIS — M25552 Pain in left hip: Secondary | ICD-10-CM | POA: Diagnosis not present

## 2016-05-23 DIAGNOSIS — L814 Other melanin hyperpigmentation: Secondary | ICD-10-CM | POA: Diagnosis not present

## 2016-05-23 DIAGNOSIS — B353 Tinea pedis: Secondary | ICD-10-CM | POA: Diagnosis not present

## 2016-05-23 DIAGNOSIS — Z79899 Other long term (current) drug therapy: Secondary | ICD-10-CM | POA: Diagnosis not present

## 2016-05-23 DIAGNOSIS — M5442 Lumbago with sciatica, left side: Secondary | ICD-10-CM | POA: Diagnosis not present

## 2016-05-23 MED FILL — NAPROXEN 500 MG TABLET: 500 | 30 days supply | Qty: 60 | Fill #0

## 2016-05-23 MED FILL — predniSONE 10 MG TABS: 10 | 6 days supply | Qty: 21 | Fill #0

## 2016-05-25 ENCOUNTER — Other Ambulatory Visit: Payer: Self-pay | Admitting: Family

## 2016-05-25 ENCOUNTER — Ambulatory Visit
Admission: RE | Admit: 2016-05-25 | Discharge: 2016-05-25 | Disposition: A | Discharge status: Home / Self Care | Payer: 59 | Source: Ambulatory Visit | Attending: Family | Admitting: Family

## 2016-05-25 DIAGNOSIS — M1612 Unilateral primary osteoarthritis, left hip: Secondary | ICD-10-CM | POA: Diagnosis not present

## 2016-05-25 DIAGNOSIS — M25552 Pain in left hip: Secondary | ICD-10-CM

## 2016-05-25 DIAGNOSIS — M5440 Lumbago with sciatica, unspecified side: Secondary | ICD-10-CM

## 2016-05-25 DIAGNOSIS — M545 Low back pain: Secondary | ICD-10-CM | POA: Diagnosis not present

## 2016-06-12 DIAGNOSIS — H5213 Myopia, bilateral: Secondary | ICD-10-CM | POA: Diagnosis not present

## 2017-07-03 DIAGNOSIS — Z79899 Other long term (current) drug therapy: Secondary | ICD-10-CM | POA: Diagnosis not present

## 2017-07-03 DIAGNOSIS — L84 Corns and callosities: Secondary | ICD-10-CM | POA: Diagnosis not present

## 2017-07-03 DIAGNOSIS — F430 Acute stress reaction: Secondary | ICD-10-CM | POA: Diagnosis not present

## 2017-07-03 DIAGNOSIS — L659 Nonscarring hair loss, unspecified: Secondary | ICD-10-CM | POA: Diagnosis not present

## 2017-07-03 DIAGNOSIS — F411 Generalized anxiety disorder: Secondary | ICD-10-CM | POA: Diagnosis not present

## 2017-07-03 MED FILL — TRIAMCINOLONE 0.5% CREAM: 0.5 | 15 days supply | Qty: 60 | Fill #0

## 2017-07-03 MED FILL — SERTRALINE HCL 50 MG TABLET: 50 | 30 days supply | Qty: 30 | Fill #0

## 2017-07-04 MED FILL — diazePAM 5 MG TABS: 5 | 30 days supply | Qty: 60 | Fill #0

## 2017-07-31 DIAGNOSIS — Z01411 Encounter for gynecological examination (general) (routine) with abnormal findings: Secondary | ICD-10-CM | POA: Diagnosis not present

## 2017-07-31 DIAGNOSIS — Z114 Encounter for screening for human immunodeficiency virus [HIV]: Secondary | ICD-10-CM | POA: Diagnosis not present

## 2017-07-31 DIAGNOSIS — Z0000 Encounter for general adult medical examination without abnormal findings: Secondary | ICD-10-CM | POA: Diagnosis not present

## 2017-08-15 MED FILL — VIT D2 1.25 MG (50,000 UNIT: 1.25 MG | 28 days supply | Qty: 8 | Fill #0

## 2017-08-15 MED FILL — FUSION PLUS CAPSULE: 30 days supply | Qty: 30 | Fill #0

## 2017-10-15 DIAGNOSIS — H524 Presbyopia: Secondary | ICD-10-CM | POA: Diagnosis not present

## 2018-05-26 DIAGNOSIS — E663 Overweight: Secondary | ICD-10-CM | POA: Diagnosis not present

## 2018-05-26 DIAGNOSIS — F430 Acute stress reaction: Secondary | ICD-10-CM | POA: Diagnosis not present

## 2018-05-26 DIAGNOSIS — S29012A Strain of muscle and tendon of back wall of thorax, initial encounter: Secondary | ICD-10-CM | POA: Diagnosis not present

## 2018-05-26 DIAGNOSIS — Z79899 Other long term (current) drug therapy: Secondary | ICD-10-CM | POA: Diagnosis not present

## 2018-05-26 DIAGNOSIS — F411 Generalized anxiety disorder: Secondary | ICD-10-CM | POA: Diagnosis not present

## 2018-05-26 DIAGNOSIS — M79604 Pain in right leg: Secondary | ICD-10-CM | POA: Diagnosis not present

## 2018-07-07 MED FILL — FUSION PLUS CAPSULE: 30 days supply | Qty: 30 | Fill #1

## 2018-11-03 DIAGNOSIS — R42 Dizziness and giddiness: Secondary | ICD-10-CM | POA: Diagnosis not present

## 2018-11-03 DIAGNOSIS — L74512 Primary focal hyperhidrosis, palms: Secondary | ICD-10-CM | POA: Diagnosis not present

## 2018-11-03 DIAGNOSIS — N951 Menopausal and female climacteric states: Secondary | ICD-10-CM | POA: Diagnosis not present

## 2018-11-03 DIAGNOSIS — F411 Generalized anxiety disorder: Secondary | ICD-10-CM | POA: Diagnosis not present

## 2018-11-04 MED FILL — ALPRAZolam 0.5 MG TABS: 0.5 | 30 days supply | Qty: 60 | Fill #0

## 2019-05-19 DIAGNOSIS — H524 Presbyopia: Secondary | ICD-10-CM | POA: Diagnosis not present

## 2019-06-01 DIAGNOSIS — M940 Chondrocostal junction syndrome [Tietze]: Secondary | ICD-10-CM | POA: Diagnosis not present

## 2019-06-01 DIAGNOSIS — Z79899 Other long term (current) drug therapy: Secondary | ICD-10-CM | POA: Diagnosis not present

## 2019-06-01 DIAGNOSIS — R0600 Dyspnea, unspecified: Secondary | ICD-10-CM | POA: Diagnosis not present

## 2019-06-02 ENCOUNTER — Ambulatory Visit
Admission: RE | Admit: 2019-06-02 | Discharge: 2019-06-02 | Disposition: A | Discharge status: Home / Self Care | Payer: 59 | Source: Ambulatory Visit | Attending: Family | Admitting: Family

## 2019-06-02 ENCOUNTER — Other Ambulatory Visit: Payer: Self-pay | Admitting: Family

## 2019-06-02 DIAGNOSIS — R0602 Shortness of breath: Secondary | ICD-10-CM

## 2019-06-02 DIAGNOSIS — R079 Chest pain, unspecified: Secondary | ICD-10-CM | POA: Diagnosis not present

## 2019-06-02 MED FILL — ALPRAZolam 0.5 MG TABS: 0.5 | 30 days supply | Qty: 60 | Fill #0

## 2019-09-28 DIAGNOSIS — Z01419 Encounter for gynecological examination (general) (routine) without abnormal findings: Secondary | ICD-10-CM | POA: Diagnosis not present

## 2019-09-28 DIAGNOSIS — Z0000 Encounter for general adult medical examination without abnormal findings: Secondary | ICD-10-CM | POA: Diagnosis not present

## 2019-10-01 MED FILL — VIT D2 1.25 MG (50,000 UNIT: 1.25 MG | 28 days supply | Qty: 9 | Fill #0

## 2019-10-01 MED FILL — FUSION PLUS CAPSULE: 30 days supply | Qty: 30 | Fill #0

## 2020-03-03 DIAGNOSIS — E559 Vitamin D deficiency, unspecified: Secondary | ICD-10-CM | POA: Diagnosis not present

## 2020-03-03 DIAGNOSIS — R5383 Other fatigue: Secondary | ICD-10-CM | POA: Diagnosis not present

## 2020-03-03 DIAGNOSIS — F411 Generalized anxiety disorder: Secondary | ICD-10-CM | POA: Diagnosis not present

## 2020-03-03 MED FILL — ALPRAZolam 0.5 MG TABS: 0.5 | 30 days supply | Qty: 60 | Fill #0

## 2020-03-03 MED FILL — VIT D2 1.25 MG (50,000 UNIT: 1.25 MG | 30 days supply | Qty: 10 | Fill #0

## 2020-03-04 DIAGNOSIS — R5383 Other fatigue: Secondary | ICD-10-CM | POA: Diagnosis not present

## 2020-07-07 MED FILL — AMOX-CLAV 500-125 MG TABLET: 500-125 | 10 days supply | Qty: 20 | Fill #0

## 2020-12-28 DIAGNOSIS — H524 Presbyopia: Secondary | ICD-10-CM | POA: Diagnosis not present

## 2021-02-03 ENCOUNTER — Other Ambulatory Visit (HOSPITAL_COMMUNITY): Payer: Self-pay

## 2021-02-03 DIAGNOSIS — Z1211 Encounter for screening for malignant neoplasm of colon: Secondary | ICD-10-CM | POA: Diagnosis not present

## 2021-02-03 DIAGNOSIS — Z803 Family history of malignant neoplasm of breast: Secondary | ICD-10-CM | POA: Diagnosis not present

## 2021-02-03 DIAGNOSIS — Z23 Encounter for immunization: Secondary | ICD-10-CM | POA: Diagnosis not present

## 2021-02-03 DIAGNOSIS — Z0001 Encounter for general adult medical examination with abnormal findings: Secondary | ICD-10-CM | POA: Diagnosis not present

## 2021-02-03 DIAGNOSIS — Z6826 Body mass index (BMI) 26.0-26.9, adult: Secondary | ICD-10-CM | POA: Diagnosis not present

## 2021-02-03 DIAGNOSIS — Z0000 Encounter for general adult medical examination without abnormal findings: Secondary | ICD-10-CM | POA: Diagnosis not present

## 2021-02-03 DIAGNOSIS — Z136 Encounter for screening for cardiovascular disorders: Secondary | ICD-10-CM | POA: Diagnosis not present

## 2021-02-03 DIAGNOSIS — R109 Unspecified abdominal pain: Secondary | ICD-10-CM | POA: Diagnosis not present

## 2021-02-03 DIAGNOSIS — E559 Vitamin D deficiency, unspecified: Secondary | ICD-10-CM | POA: Diagnosis not present

## 2021-02-03 DIAGNOSIS — Z114 Encounter for screening for human immunodeficiency virus [HIV]: Secondary | ICD-10-CM | POA: Diagnosis not present

## 2021-02-03 MED ORDER — FERROUS GLUCONATE 240 (27 FE) MG PO TABS: 1.0000 | ORAL_TABLET | Freq: Every day | ORAL | 1 refills | Status: AC

## 2021-02-06 ENCOUNTER — Other Ambulatory Visit (HOSPITAL_COMMUNITY): Payer: Self-pay

## 2021-02-06 MED ORDER — ZOSTER VAC RECOMB ADJUVANTED 50 MCG/0.5ML IM SUSR
0.5000 mL | Freq: Once | INTRAMUSCULAR | 0 refills | Status: AC
  Filled 2021-02-06 – 2021-04-13 (×3): qty 0.5, 1d supply, fill #0

## 2021-02-28 ENCOUNTER — Other Ambulatory Visit: Payer: Self-pay | Admitting: Family

## 2021-02-28 DIAGNOSIS — Z1231 Encounter for screening mammogram for malignant neoplasm of breast: Secondary | ICD-10-CM

## 2021-04-12 ENCOUNTER — Other Ambulatory Visit (HOSPITAL_COMMUNITY): Payer: Self-pay

## 2021-04-13 ENCOUNTER — Other Ambulatory Visit (HOSPITAL_COMMUNITY): Payer: Self-pay

## 2021-04-14 ENCOUNTER — Other Ambulatory Visit: Payer: Self-pay | Admitting: Nurse Practitioner

## 2021-04-14 DIAGNOSIS — R109 Unspecified abdominal pain: Secondary | ICD-10-CM

## 2021-04-21 ENCOUNTER — Other Ambulatory Visit: Payer: Self-pay

## 2021-04-21 ENCOUNTER — Ambulatory Visit
Admission: RE | Admit: 2021-04-21 | Discharge: 2021-04-21 | Disposition: A | Discharge status: Home / Self Care | Payer: 59 | Source: Ambulatory Visit | Attending: Family | Admitting: Family

## 2021-04-21 DIAGNOSIS — Z1231 Encounter for screening mammogram for malignant neoplasm of breast: Secondary | ICD-10-CM | POA: Diagnosis not present

## 2021-05-02 ENCOUNTER — Ambulatory Visit
Admission: RE | Admit: 2021-05-02 | Discharge: 2021-05-02 | Disposition: A | Discharge status: Home / Self Care | Payer: 59 | Source: Ambulatory Visit | Attending: Nurse Practitioner | Admitting: Nurse Practitioner

## 2021-05-02 DIAGNOSIS — R109 Unspecified abdominal pain: Secondary | ICD-10-CM | POA: Diagnosis not present

## 2021-05-02 MED ORDER — IOPAMIDOL (ISOVUE-300) INJECTION 61%
100.0000 mL | Freq: Once | INTRAVENOUS | Status: AC | PRN
  Administered 2021-05-02: 100 mL via INTRAVENOUS

## 2021-05-04 DIAGNOSIS — Z1211 Encounter for screening for malignant neoplasm of colon: Secondary | ICD-10-CM | POA: Diagnosis not present

## 2021-05-17 DIAGNOSIS — Z20822 Contact with and (suspected) exposure to covid-19: Secondary | ICD-10-CM | POA: Diagnosis not present

## 2021-10-11 IMAGING — CT CT ABD-PELV W/ CM
1 of 3 series · 13 of 32 positions shown, 19 images · IV contrast (APPLIED)
Comparison: None.

CLINICAL DATA: Right-sided pain with abdominal cramping and spasm.
Worsening chronic right hip/leg pain.

EXAM:
CT ABDOMEN AND PELVIS WITH CONTRAST
TECHNIQUE: Multidetector CT imaging of the abdomen and pelvis was performed
using the standard protocol following bolus administration of
intravenous contrast.
CONTRAST:  100mL 1JTA07-SDD IOPAMIDOL (1JTA07-SDD) INJECTION 61%

[Series 2: abd/pelvis w/cm · axial · 0.74mm/px · z∈[-462,-77]mm · 13 of 91 slices shown, 19 images]
[im 7/91  soft-tissue]
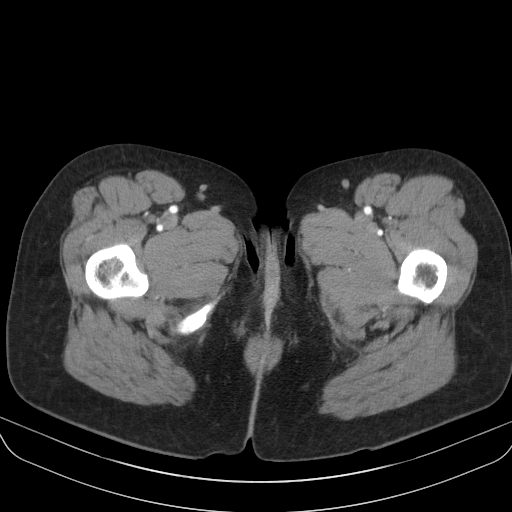
[im 7/91  bone]
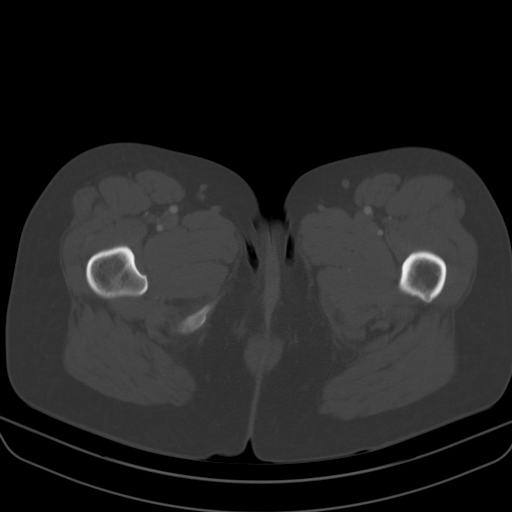
[im 13/91  soft-tissue]
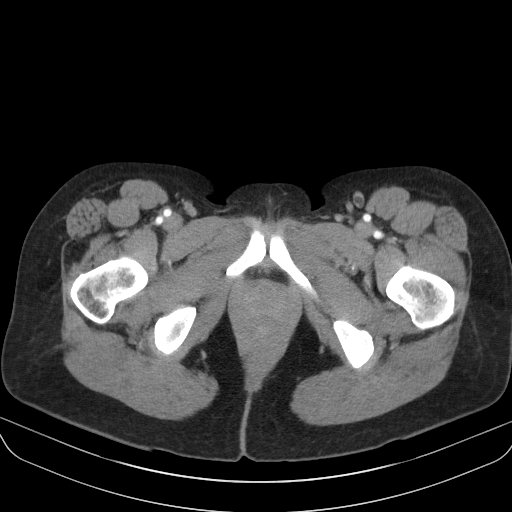
[im 20/91  soft-tissue]
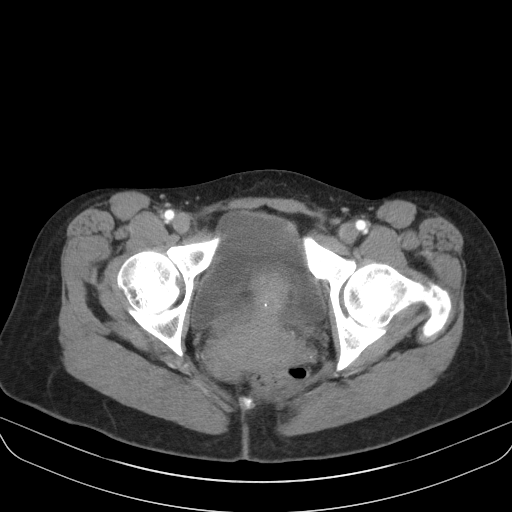
[im 26/91  soft-tissue]
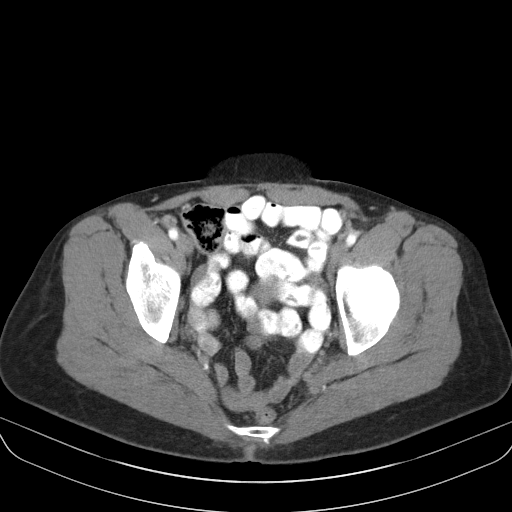
[im 33/91  soft-tissue]
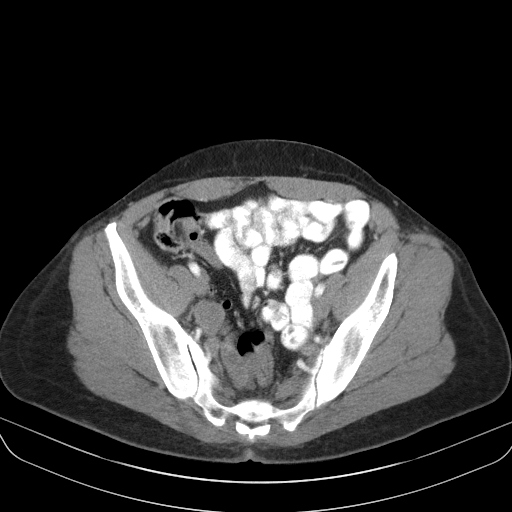
[im 39/91  soft-tissue]
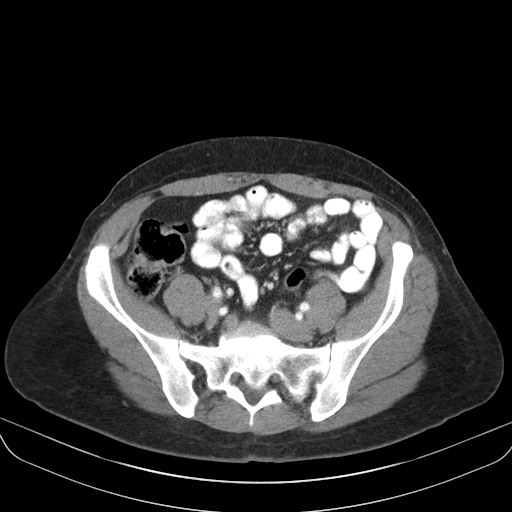
[im 46/91  soft-tissue]
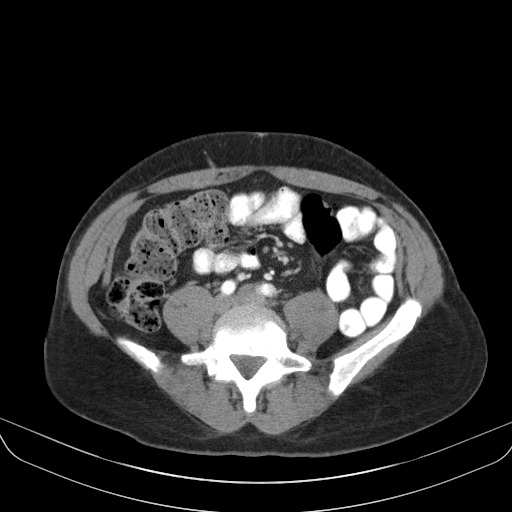
[im 52/91  soft-tissue]
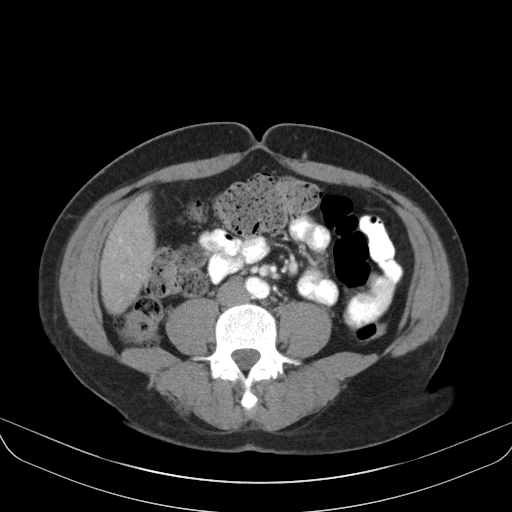
[im 58/91  soft-tissue]
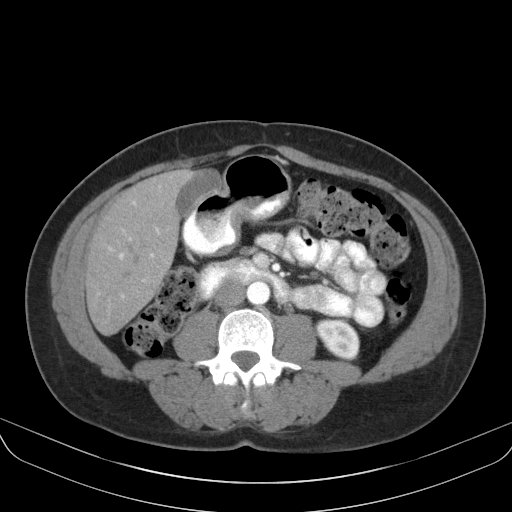
[im 58/91  bone]
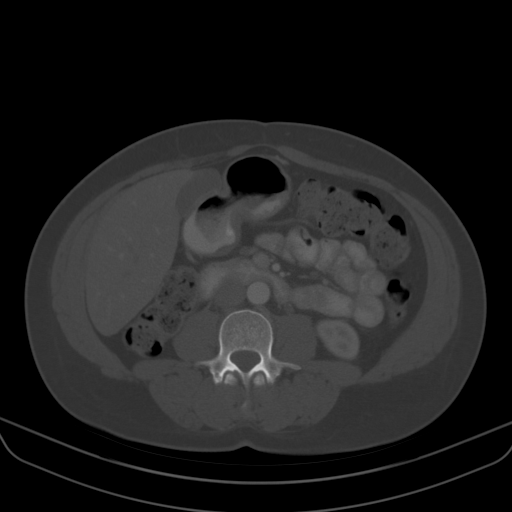
[im 65/91  soft-tissue]
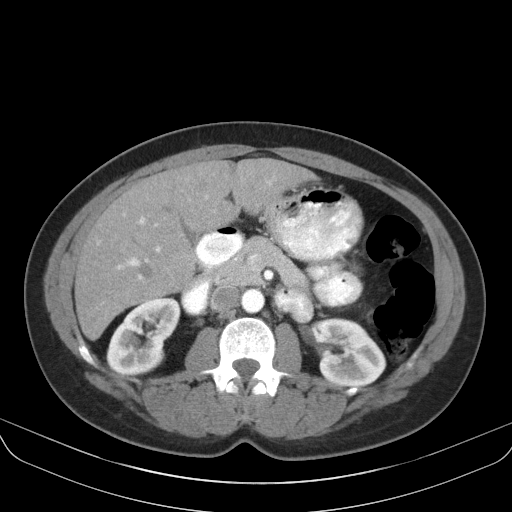
[im 65/91  lung]
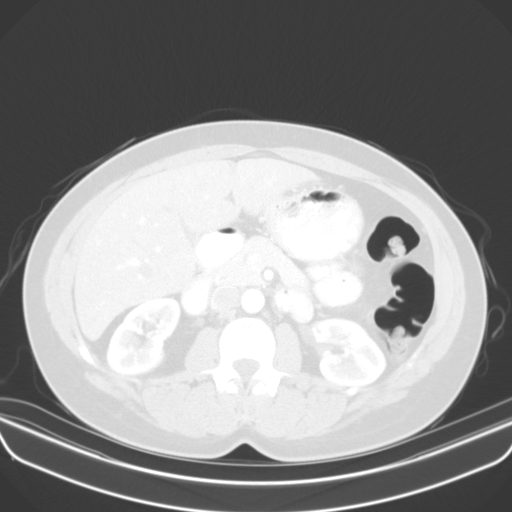
[im 71/91  soft-tissue]
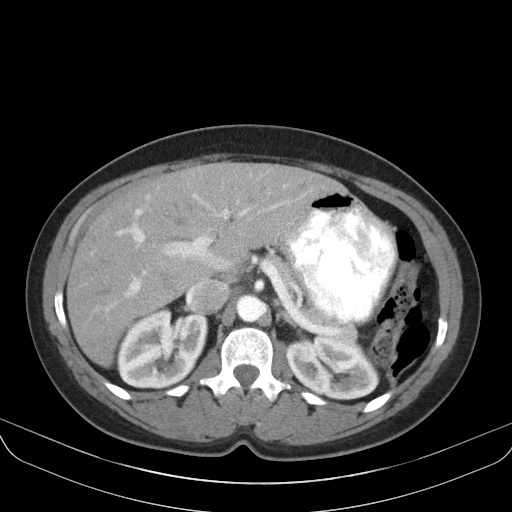
[im 71/91  lung]
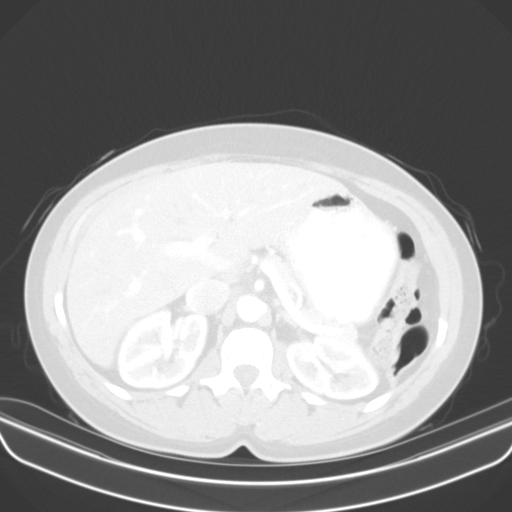
[im 78/91  soft-tissue]
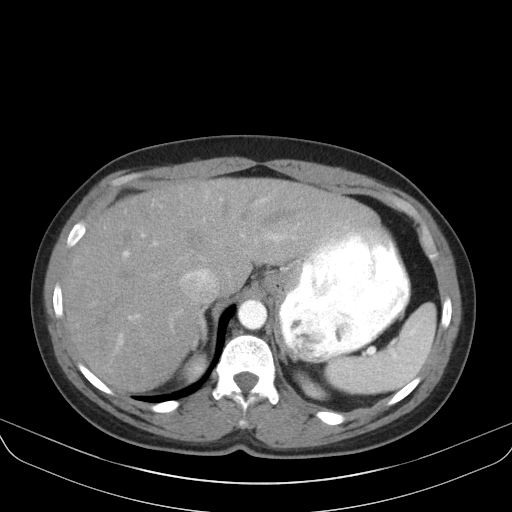
[im 78/91  lung]
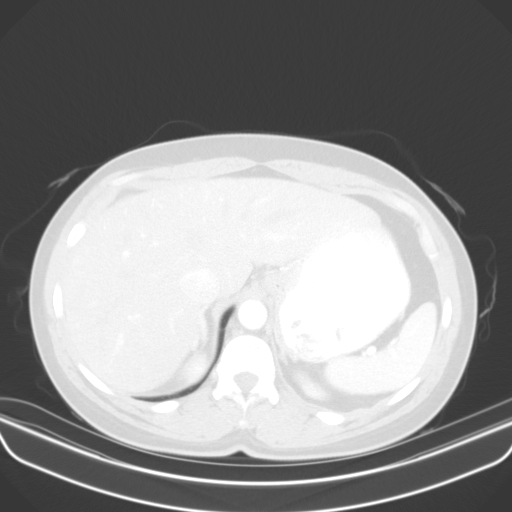
[im 84/91  soft-tissue]
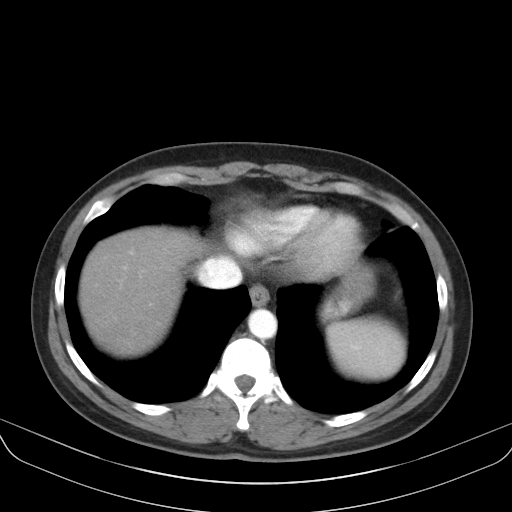
[im 84/91  lung]
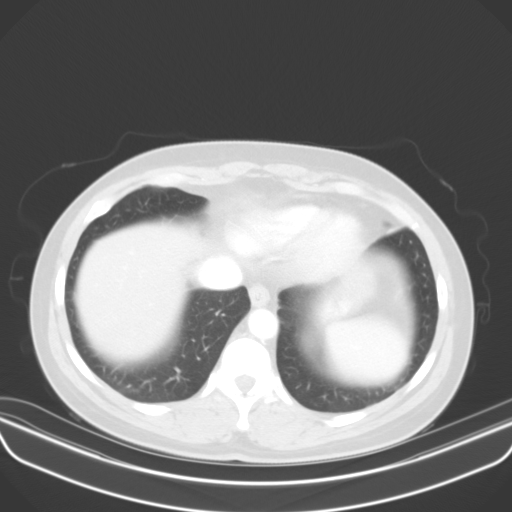

[13 of 32 positions shown; findings below may reference images not displayed]

FINDINGS: Lower chest: Linear atelectasis in the lingula. Normal size heart.
No significant pericardial effusion/thickening.

Hepatobiliary: No suspicious hepatic lesion. Gallbladder is
unremarkable. No biliary ductal dilation.

Pancreas: Unremarkable. No pancreatic ductal dilatation or
surrounding inflammatory changes.

Spleen: Normal in size without focal abnormality.

Adrenals/Urinary Tract: Adrenal glands are unremarkable. Kidneys are
normal, without renal calculi, focal lesion, or hydronephrosis.
Bladder is unremarkable solid enhancing for degree of distension.

Stomach/Bowel: Radiopaque enteric contrast traverses the distal
small bowel. Stomach is grossly unremarkable. No pathologic dilation
of small bowel. The appendix and terminal ileum appear normal. Small
volume of formed stool throughout the colon. The descending colon to
the rectum is predominantly decompressed limiting evaluation.

Vascular/Lymphatic: No abdominal aortic aneurysm. No pathologically
enlarged abdominal or pelvic lymph nodes.

Reproductive: Uterus and bilateral adnexa are unremarkable.

Other: No abdominopelvic ascites.

Musculoskeletal: L5-S1 discogenic disease with Modic type endplate
changes. No acute osseous abnormality.
IMPRESSION: 1. No acute abdominopelvic findings. Normal appendix.
2. L5-S1 discogenic disease with Modic type endplate changes.

## 2021-11-28 ENCOUNTER — Other Ambulatory Visit (HOSPITAL_COMMUNITY): Payer: Self-pay

## 2021-11-28 DIAGNOSIS — B3731 Acute candidiasis of vulva and vagina: Secondary | ICD-10-CM | POA: Diagnosis not present

## 2021-11-28 DIAGNOSIS — N898 Other specified noninflammatory disorders of vagina: Secondary | ICD-10-CM | POA: Diagnosis not present

## 2021-11-28 DIAGNOSIS — Z202 Contact with and (suspected) exposure to infections with a predominantly sexual mode of transmission: Secondary | ICD-10-CM | POA: Diagnosis not present

## 2021-11-28 DIAGNOSIS — R350 Frequency of micturition: Secondary | ICD-10-CM | POA: Diagnosis not present

## 2021-11-28 MED ORDER — FLUCONAZOLE 150 MG PO TABS
150.0000 mg | ORAL_TABLET | ORAL | 0 refills | Status: DC
  Filled 2021-11-28: qty 2, 3d supply, fill #0

## 2022-02-06 ENCOUNTER — Other Ambulatory Visit (HOSPITAL_COMMUNITY): Payer: Self-pay

## 2022-02-06 DIAGNOSIS — Z136 Encounter for screening for cardiovascular disorders: Secondary | ICD-10-CM | POA: Diagnosis not present

## 2022-02-06 DIAGNOSIS — A5900 Urogenital trichomoniasis, unspecified: Secondary | ICD-10-CM | POA: Diagnosis not present

## 2022-02-06 DIAGNOSIS — Z1331 Encounter for screening for depression: Secondary | ICD-10-CM | POA: Diagnosis not present

## 2022-02-06 DIAGNOSIS — Z0001 Encounter for general adult medical examination with abnormal findings: Secondary | ICD-10-CM | POA: Diagnosis not present

## 2022-02-06 DIAGNOSIS — Z124 Encounter for screening for malignant neoplasm of cervix: Secondary | ICD-10-CM | POA: Diagnosis not present

## 2022-02-06 DIAGNOSIS — B351 Tinea unguium: Secondary | ICD-10-CM | POA: Diagnosis not present

## 2022-02-06 MED ORDER — TERBINAFINE HCL 250 MG PO TABS
250.0000 mg | ORAL_TABLET | Freq: Every day | ORAL | 1 refills | Status: DC
  Filled 2022-02-06: qty 30, 30d supply, fill #0

## 2022-02-06 MED ORDER — METRONIDAZOLE 500 MG PO TABS
500.0000 mg | ORAL_TABLET | Freq: Two times a day (BID) | ORAL | 0 refills | Status: DC
  Filled 2022-02-06: qty 14, 7d supply, fill #0

## 2022-05-15 DIAGNOSIS — H6123 Impacted cerumen, bilateral: Secondary | ICD-10-CM | POA: Diagnosis not present

## 2022-05-15 DIAGNOSIS — R002 Palpitations: Secondary | ICD-10-CM | POA: Diagnosis not present

## 2022-05-15 DIAGNOSIS — R350 Frequency of micturition: Secondary | ICD-10-CM | POA: Diagnosis not present

## 2022-08-10 DIAGNOSIS — H524 Presbyopia: Secondary | ICD-10-CM | POA: Diagnosis not present

## 2022-10-19 ENCOUNTER — Other Ambulatory Visit (HOSPITAL_COMMUNITY): Payer: Self-pay

## 2022-10-19 DIAGNOSIS — Z136 Encounter for screening for cardiovascular disorders: Secondary | ICD-10-CM | POA: Diagnosis not present

## 2022-10-19 DIAGNOSIS — Z720 Tobacco use: Secondary | ICD-10-CM | POA: Diagnosis not present

## 2022-10-19 DIAGNOSIS — I10 Essential (primary) hypertension: Secondary | ICD-10-CM | POA: Diagnosis not present

## 2022-10-19 DIAGNOSIS — Z1159 Encounter for screening for other viral diseases: Secondary | ICD-10-CM | POA: Diagnosis not present

## 2022-10-19 DIAGNOSIS — R059 Cough, unspecified: Secondary | ICD-10-CM | POA: Diagnosis not present

## 2022-10-19 DIAGNOSIS — J069 Acute upper respiratory infection, unspecified: Secondary | ICD-10-CM | POA: Diagnosis not present

## 2022-10-19 MED ORDER — BENZONATATE 200 MG PO CAPS
200.0000 mg | ORAL_CAPSULE | Freq: Three times a day (TID) | ORAL | 0 refills | Status: DC | PRN
  Filled 2022-10-19: qty 15, 5d supply, fill #0

## 2022-10-19 MED ORDER — AZITHROMYCIN 250 MG PO TABS
ORAL_TABLET | ORAL | 0 refills | Status: DC
  Filled 2022-10-19: qty 6, 5d supply, fill #0

## 2022-10-19 MED ORDER — LOSARTAN POTASSIUM 25 MG PO TABS
25.0000 mg | ORAL_TABLET | Freq: Every day | ORAL | 1 refills | Status: AC
  Filled 2022-10-19: qty 30, 30d supply, fill #0

## 2022-10-23 ENCOUNTER — Other Ambulatory Visit (HOSPITAL_COMMUNITY): Payer: Self-pay

## 2023-01-25 ENCOUNTER — Other Ambulatory Visit (HOSPITAL_COMMUNITY): Payer: Self-pay

## 2023-01-25 MED ORDER — IBUPROFEN 800 MG PO TABS
800.0000 mg | ORAL_TABLET | Freq: Four times a day (QID) | ORAL | 2 refills | Status: AC | PRN
  Filled 2023-01-25: qty 20, 5d supply, fill #0

## 2023-01-25 MED ORDER — AMOXICILLIN 500 MG PO CAPS
500.0000 mg | ORAL_CAPSULE | Freq: Three times a day (TID) | ORAL | 0 refills | Status: AC
  Filled 2023-01-25: qty 21, 7d supply, fill #0

## 2023-02-11 NOTE — Progress Notes (Signed)
Subjective:    Laura Todd - 52 y.o. female MRN [id number]  Date of birth: 10-23-1971  HPI  Laura Todd is to establish care.   Current issues and/or concerns: - Chronic right leg pain. She denies recent trauma/injury and associated red flag symptoms. - Reports blood pressures outside of office usually 140's/90's or less. States she thinks her blood pressure is elevated today due to chronic right leg pain. She denies red flag symptoms such as but not limited to chest pain, shortness of breath, worst headache of life, nausea/vomiting.  - Chronic left upper abdominal pain for at least 5 months. Reports the same began after she took an antibiotic from an external primary care provider. She denies associated red flag symptoms. - No further issues/concerns for discussion today.    ROS per HPI    Health Maintenance:  Health Maintenance Due  Topic Date Due   COVID-19 Vaccine (1) Never done   HIV Screening  Never done   Hepatitis C Screening  Never done   PAP SMEAR-Modifier  Never done   COLONOSCOPY (Pts 45-51yrs Insurance coverage will need to be confirmed)  Never done   Zoster Vaccines- Shingrix (1 of 2) Never done     Past Medical History: There are no problems to display for this patient.     Social History   reports that she has been smoking cigars. She does not have any smokeless tobacco history on file. She reports that she does not drink alcohol and does not use drugs.   Family History  family history includes Breast cancer in her mother; HIV in her father.   Medications: reviewed and updated   Objective:   Physical Exam BP (!) 156/95 (BP Location: Left Arm, Patient Position: Sitting, Cuff Size: Normal)   Pulse 67   Temp 98.8 F (37.1 C) (Oral)   Resp 20   Ht 5' 4.5" (1.638 m)   Wt 150 lb (68 kg)   LMP 12/18/2013   SpO2 98%   BMI 25.35 kg/m   Physical Exam HENT:     Head: Normocephalic and atraumatic.  Eyes:     Extraocular Movements: Extraocular  movements intact.     Conjunctiva/sclera: Conjunctivae normal.     Pupils: Pupils are equal, round, and reactive to light.  Cardiovascular:     Rate and Rhythm: Normal rate and regular rhythm.     Pulses: Normal pulses.     Heart sounds: Normal heart sounds.  Pulmonary:     Effort: Pulmonary effort is normal.     Breath sounds: Normal breath sounds.  Abdominal:     General: Bowel sounds are normal.     Palpations: Abdomen is soft.     Tenderness: There is abdominal tenderness.  Musculoskeletal:        General: Normal range of motion.     Cervical back: Normal range of motion and neck supple.     Right hip: Tenderness present.     Left hip: Normal.     Right upper leg: Normal.     Left upper leg: Normal.     Right knee: Normal.     Left knee: Normal.     Right lower leg: Normal.     Left lower leg: Normal.     Right ankle: Normal.     Left ankle: Normal.     Right foot: Normal.     Left foot: Normal.  Neurological:     General: No focal deficit present.  Mental Status: She is alert and oriented to person, place, and time.  Psychiatric:        Mood and Affect: Mood normal.        Behavior: Behavior normal.       Assessment & Plan:  1. Encounter to establish care - Patient presents today to establish care. During the interim follow-up with primary provider as scheduled.  - Return for annual physical examination, labs, and health maintenance. Arrive fasting meaning having no food for at least 8 hours prior to appointment. You may have only water or black coffee. Please take scheduled medications as normal.  2. Chronic pain of right lower extremity 3. Chronic right hip pain - Duloxetine as prescribed. Counseled on medication adherence/adverse effects.  - Referral to Orthopedics for further evaluation/management. During the interim follow-up with primary provider as scheduled.  - DULoxetine (CYMBALTA) 20 MG capsule; Take 1 capsule (20 mg total) by mouth daily.  Dispense:  30 capsule; Refill: 1 - Ambulatory referral to Orthopedics  4. Chronic abdominal pain - Ultrasound abdomen for further evaluation.  - US Abdomen Complete; Future    Patient was given clear instructions to go to Emergency Department or return to medical center if symptoms don't improve, worsen, or new problems develop.The patient verbalized understanding.  I discussed the assessment and treatment plan with the patient. The patient was provided an opportunity to ask questions and all were answered. The patient agreed with the plan and demonstrated an understanding of the instructions.   The patient was advised to call back or seek an in-person evaluation if the symptoms worsen or if the condition fails to improve as anticipated.    Ricky Stabs, NP 02/18/2023, 3:12 PM Primary Care at [facility]

## 2023-02-18 ENCOUNTER — Ambulatory Visit (INDEPENDENT_AMBULATORY_CARE_PROVIDER_SITE_OTHER): Payer: Commercial Managed Care - PPO | Admitting: Family

## 2023-02-18 ENCOUNTER — Other Ambulatory Visit (HOSPITAL_COMMUNITY): Payer: Self-pay

## 2023-02-18 ENCOUNTER — Encounter: Payer: Self-pay | Admitting: Family

## 2023-02-18 VITALS — BP 156/95 | HR 67 | Temp 98.8°F | Resp 20 | Ht 64.5 in | Wt 150.0 lb

## 2023-02-18 DIAGNOSIS — Z7689 Persons encountering health services in other specified circumstances: Secondary | ICD-10-CM | POA: Diagnosis not present

## 2023-02-18 DIAGNOSIS — R109 Unspecified abdominal pain: Secondary | ICD-10-CM | POA: Diagnosis not present

## 2023-02-18 DIAGNOSIS — M79604 Pain in right leg: Secondary | ICD-10-CM | POA: Diagnosis not present

## 2023-02-18 DIAGNOSIS — M25551 Pain in right hip: Secondary | ICD-10-CM | POA: Diagnosis not present

## 2023-02-18 DIAGNOSIS — G8929 Other chronic pain: Secondary | ICD-10-CM

## 2023-02-18 MED ORDER — DULOXETINE HCL 20 MG PO CPEP
20.0000 mg | ORAL_CAPSULE | Freq: Every day | ORAL | 1 refills | Status: AC
  Filled 2023-02-18: qty 30, 30d supply, fill #0

## 2023-02-18 NOTE — Progress Notes (Signed)
Concerns with a knot and pain in left upper quadrant  Requesting an XR  Does not wish to do core exercised d/t pain   Right leg aching right l

## 2023-02-18 NOTE — Patient Instructions (Signed)
Thank you for choosing Primary Care at [facility] for your medical home!    Laura Todd was seen by Rema Fendt, NP today.   Laura Todd's primary care provider is Ricky Stabs, NP.   For the best care possible,  you should try to see Ricky Stabs, NP whenever you come to office.   We look forward to seeing you again soon!  If you have any questions about your visit today,  please call us at [phone]  Or feel free to reach your provider via MyChart.    Keeping you healthy   Get these tests Blood pressure- Have your blood pressure checked once a year by your healthcare provider.  Normal blood pressure is 120/80. Weight- Have your body mass index (BMI) calculated to screen for obesity.  BMI is a measure of body fat based on height and weight. You can also calculate your own BMI at https://www.west-esparza.com/. Cholesterol- Have your cholesterol checked regularly starting at age 49, sooner may be necessary if you have diabetes, high blood pressure, if a family member developed heart diseases at an early age or if you smoke.  Chlamydia, HIV, and other sexual transmitted disease- Get screened each year until the age of 29 then within three months of each new sexual partner. Diabetes- Have your blood sugar checked regularly if you have high blood pressure, high cholesterol, a family history of diabetes or if you are overweight.   Get these vaccines Flu shot- Every fall. Tetanus shot- Every 10 years. Menactra- Single dose; prevents meningitis.   Take these steps Don't smoke- If you do smoke, ask your healthcare provider about quitting. For tips on how to quit, go to www.smokefree.gov or call 1-800-QUIT-NOW. Be physically active- Exercise 5 days a week for at least 30 minutes.  If you are not already physically active start slow and gradually work up to 30 minutes of moderate physical activity.  Examples of moderate activity include walking briskly, mowing the yard, dancing,  swimming bicycling, etc. Eat a healthy diet- Eat a variety of healthy foods such as fruits, vegetables, low fat milk, low fat cheese, yogurt, lean meats, poultry, fish, beans, tofu, etc.  For more information on healthy eating, go to www.thenutritionsource.org Drink alcohol in moderation- Limit alcohol intake two drinks or less a day.  Never drink and drive. Dentist- Brush and floss teeth twice daily; visit your dentis twice a year. Depression-Your emotional health is as important as your physical health.  If you're feeling down, losing interest in things you normally enjoy please talk with your healthcare provider. Gun Safety- If you keep a gun in your home, keep it unloaded and with the safety lock on.  Bullets should be stored separately. Helmet use- Always wear a helmet when riding a motorcycle, bicycle, rollerblading or skateboarding. Safe sex- If you may be exposed to a sexually transmitted infection, use a condom Seat belts- Seat bels can save your life; always wear one. Smoke/Carbon Monoxide detectors- These detectors need to be installed on the appropriate level of your home.  Replace batteries at least once a year. Skin Cancer- When out in the sun, cover up and use sunscreen SPF 15 or higher. Violence- If anyone is threatening or hurting you, please tell your healthcare provider.

## 2023-02-22 ENCOUNTER — Other Ambulatory Visit (INDEPENDENT_AMBULATORY_CARE_PROVIDER_SITE_OTHER): Payer: Commercial Managed Care - PPO

## 2023-02-22 ENCOUNTER — Other Ambulatory Visit (HOSPITAL_COMMUNITY): Payer: Self-pay

## 2023-02-22 ENCOUNTER — Ambulatory Visit: Payer: Commercial Managed Care - PPO | Admitting: Physician Assistant

## 2023-02-22 ENCOUNTER — Encounter: Payer: Self-pay | Admitting: Physician Assistant

## 2023-02-22 DIAGNOSIS — M79604 Pain in right leg: Secondary | ICD-10-CM

## 2023-02-22 MED ORDER — PREDNISONE 10 MG (21) PO TBPK
ORAL_TABLET | ORAL | 0 refills | Status: AC
  Filled 2023-02-22: qty 21, 6d supply, fill #0

## 2023-02-22 MED ORDER — METHOCARBAMOL 750 MG PO TABS
750.0000 mg | ORAL_TABLET | Freq: Two times a day (BID) | ORAL | 0 refills | Status: AC | PRN
  Filled 2023-02-22: qty 20, 10d supply, fill #0

## 2023-02-22 NOTE — Progress Notes (Signed)
   Office Visit Note   Patient: Laura Todd           Date of Birth: 1971-10-27           MRN: [id number] Visit Date: 02/22/2023              Requested by: Rema Fendt, NP [address] [place],  [place] [address] PCP: Maryagnes Amos, FNP   Assessment & Plan: Visit Diagnoses:  1. Pain in right leg     Plan: Impression is chronic right leg pain.  We have discussed that this could be coming from the trochanteric bursitis versus lumbar spine.  I have recommended trochanteric bursa injection as well as IT band exercises.  She would like to hold off on the injection for now and try a course of steroids instead.  She will follow-up with [place] if her symptoms do not prove.  Follow-Up Instructions: No follow-ups on file.   Orders:  Orders Placed This Encounter  Procedures   XR Lumbar Spine 2-3 Views   XR Pelvis 1-2 Views   No orders of the defined types were placed in this encounter.     Procedures: No procedures performed   Clinical Data: No additional findings.   Subjective: Chief Complaint  Patient presents with   Right Leg - Pain    HPI patient is a very pleasant 52 year old [facility] who comes in today with right leg pain.  The pain starts circumferentially around her proximal thigh and radiates down the entire leg.  Occasional pain in the groin and buttock.  No pain in the back.  She describes this as a constant ache worse at night when she is lying down.  She denies any swelling to the right lower extremity.  She does not take medication for this.  No previous history of back or hip pathology.  No paresthesias.  Review of Systems as detailed in HPI.  All others reviewed and are negative.   Objective: Vital Signs: LMP 12/18/2013   Physical Exam well-developed well-nourished female no acute distress.  Alert and oriented x 3.  Ortho Exam right leg exam: Marked tenderness to the greater trochanter.  No pain with logroll, FADIR or  Stinchfield testing.  Negative straight leg raise.  Calf is soft nontender.  No swelling to the right lower extremity.  Unremarkable knee exam.  She is neurovascularly intact distally.  Specialty Comments:  No specialty comments available.  Imaging: XR Lumbar Spine 2-3 Views  Result Date: 02/22/2023 Moderate degenerative changes L5-S1  XR Pelvis 1-2 Views  Result Date: 02/22/2023 No acute or structural abnormalities    PMFS History: There are no problems to display for this patient.  History reviewed. No pertinent past medical history.  Family History  Problem Relation Age of Onset   Breast cancer Mother    HIV Father     Past Surgical History:  Procedure Laterality Date   CESAREAN SECTION  1998, 1992    x 2   Social History   Occupational History   Not on file  Tobacco Use   Smoking status: Some Days    Types: Cigars   Smokeless tobacco: Not on file  Substance and Sexual Activity   Alcohol use: No   Drug use: No   Sexual activity: Not on file

## 2023-02-25 ENCOUNTER — Other Ambulatory Visit (HOSPITAL_COMMUNITY): Payer: Self-pay

## 2023-03-06 ENCOUNTER — Ambulatory Visit
Admission: RE | Admit: 2023-03-06 | Discharge: 2023-03-06 | Disposition: A | Discharge status: Home / Self Care | Payer: Commercial Managed Care - PPO | Source: Ambulatory Visit | Attending: Family | Admitting: Family

## 2023-03-06 DIAGNOSIS — G8929 Other chronic pain: Secondary | ICD-10-CM

## 2023-03-06 DIAGNOSIS — R109 Unspecified abdominal pain: Secondary | ICD-10-CM | POA: Diagnosis not present

## 2023-03-08 ENCOUNTER — Other Ambulatory Visit: Payer: Self-pay | Admitting: Family

## 2023-03-08 DIAGNOSIS — G8929 Other chronic pain: Secondary | ICD-10-CM

## 2023-05-16 ENCOUNTER — Encounter: Payer: Self-pay | Admitting: Nurse Practitioner

## 2023-05-16 ENCOUNTER — Other Ambulatory Visit: Payer: Self-pay | Admitting: Family

## 2023-05-16 DIAGNOSIS — Z1231 Encounter for screening mammogram for malignant neoplasm of breast: Secondary | ICD-10-CM

## 2023-05-24 ENCOUNTER — Ambulatory Visit
Admission: RE | Admit: 2023-05-24 | Discharge: 2023-05-24 | Disposition: A | Discharge status: Home / Self Care | Payer: Commercial Managed Care - PPO | Source: Ambulatory Visit | Attending: Family | Admitting: Family

## 2023-05-24 DIAGNOSIS — Z1231 Encounter for screening mammogram for malignant neoplasm of breast: Secondary | ICD-10-CM

## 2023-07-30 ENCOUNTER — Ambulatory Visit: Payer: Commercial Managed Care - PPO | Admitting: Nurse Practitioner

## 2023-12-03 DIAGNOSIS — H524 Presbyopia: Secondary | ICD-10-CM | POA: Diagnosis not present

## 2024-02-19 ENCOUNTER — Ambulatory Visit (INDEPENDENT_AMBULATORY_CARE_PROVIDER_SITE_OTHER): Payer: Commercial Managed Care - PPO | Admitting: Family

## 2024-02-19 ENCOUNTER — Encounter: Payer: Self-pay | Admitting: Family

## 2024-02-19 ENCOUNTER — Other Ambulatory Visit (HOSPITAL_COMMUNITY)
Admission: RE | Admit: 2024-02-19 | Discharge: 2024-02-19 | Disposition: A | Discharge status: Home / Self Care | Source: Ambulatory Visit | Attending: Family | Admitting: Family

## 2024-02-19 VITALS — BP 133/88 | HR 72 | Temp 98.3°F | Resp 16 | Ht 66.0 in | Wt 144.4 lb

## 2024-02-19 DIAGNOSIS — Z1211 Encounter for screening for malignant neoplasm of colon: Secondary | ICD-10-CM

## 2024-02-19 DIAGNOSIS — Z1329 Encounter for screening for other suspected endocrine disorder: Secondary | ICD-10-CM

## 2024-02-19 DIAGNOSIS — Z124 Encounter for screening for malignant neoplasm of cervix: Secondary | ICD-10-CM

## 2024-02-19 DIAGNOSIS — Z113 Encounter for screening for infections with a predominantly sexual mode of transmission: Secondary | ICD-10-CM | POA: Insufficient documentation

## 2024-02-19 DIAGNOSIS — Z131 Encounter for screening for diabetes mellitus: Secondary | ICD-10-CM | POA: Diagnosis not present

## 2024-02-19 DIAGNOSIS — Z13228 Encounter for screening for other metabolic disorders: Secondary | ICD-10-CM | POA: Diagnosis not present

## 2024-02-19 DIAGNOSIS — Z13220 Encounter for screening for lipoid disorders: Secondary | ICD-10-CM | POA: Diagnosis not present

## 2024-02-19 DIAGNOSIS — Z114 Encounter for screening for human immunodeficiency virus [HIV]: Secondary | ICD-10-CM

## 2024-02-19 DIAGNOSIS — Z1159 Encounter for screening for other viral diseases: Secondary | ICD-10-CM | POA: Diagnosis not present

## 2024-02-19 DIAGNOSIS — B351 Tinea unguium: Secondary | ICD-10-CM | POA: Diagnosis not present

## 2024-02-19 DIAGNOSIS — Z0000 Encounter for general adult medical examination without abnormal findings: Secondary | ICD-10-CM | POA: Diagnosis not present

## 2024-02-19 DIAGNOSIS — Z1231 Encounter for screening mammogram for malignant neoplasm of breast: Secondary | ICD-10-CM

## 2024-02-19 DIAGNOSIS — Z130 Encounter for screening for diseases of the blood and blood-forming organs and certain disorders involving the immune mechanism: Secondary | ICD-10-CM | POA: Diagnosis not present

## 2024-02-19 NOTE — Progress Notes (Signed)
 Patient ID: [name], female    DOB: 12-01-1970  MRN: [id number]  CC: Annual Exam  Subjective: Laura Todd is a 53 y.o. female who presents for annual exam.   Her concerns today include:  - States bump underneath breast comes and goes. Denies red flag symptoms. States she thinks it may be a "blackhead".  - Toenail fungus.  - Reports she plans to return at later date for pap smear.   There are no active problems to display for this patient.    Current Outpatient Medications on File Prior to Visit  Medication Sig Dispense Refill   Biotin 1000 MCG CHEW Chew by mouth.     Ferrous Fumarate (HEMOCYTE - 106 MG FE) 324 (106 Fe) MG TABS tablet Take 1 tablet by mouth.     magnesium gluconate (MAGONATE) 500 (27 Mg) MG TABS tablet Take 500 mg by mouth daily.     Multiple Vitamins-Minerals (MULTIVITAMIN WITH MINERALS) tablet Take 1 tablet by mouth daily.     omega-3 acid ethyl esters (LOVAZA) 1 g capsule Take by mouth 2 (two) times daily.     amoxicillin  (AMOXIL ) 500 MG capsule Take 1 capsule (500 mg total) by mouth 3 (three) times daily until gone (Patient not taking: Reported on 02/19/2024) 21 capsule 0   DULoxetine  (CYMBALTA ) 20 MG capsule Take 1 capsule (20 mg total) by mouth daily. (Patient not taking: Reported on 02/19/2024) 30 capsule 1   ferrous gluconate  (FERGON) 240 (27 FE) MG tablet Take 1 tablet (240 mg total) by mouth daily before meals (Patient not taking: Reported on 02/19/2024) 90 tablet 1   ibuprofen  (ADVIL ) 800 MG tablet Take 1 tablet (800 mg total) by mouth every 6 (six) hours as needed for pain (Patient not taking: Reported on 02/19/2024) 20 tablet 2   ibuprofen  (ADVIL ,MOTRIN ) 200 MG tablet Take 200 mg by mouth every 6 (six) hours as needed for mild pain or moderate pain. (Patient not taking: Reported on 02/19/2024)     losartan  (COZAAR ) 25 MG tablet Take 1 tablet (25 mg total) by mouth daily. 30 tablet 1   methocarbamol  (ROBAXIN -750) 750 MG tablet Take 1 tablet (750 mg  total) by mouth 2 (two) times daily as needed for muscle spasms. 20 tablet 0   predniSONE  (STERAPRED UNI-PAK 21 TAB) 10 MG (21) TBPK tablet Take as directed (Patient not taking: Reported on 02/19/2024) 21 tablet 0   tolterodine  (DETROL  LA) 4 MG 24 hr capsule Take 1 capsule (4 mg total) by mouth daily. 30 capsule 1   No current facility-administered medications on file prior to visit.    No Known Allergies  Social History   Socioeconomic History   Marital status: Single    Spouse name: Not on file   Number of children: Not on file   Years of education: Not on file   Highest education level: Not on file  Occupational History   Not on file  Tobacco Use   Smoking status: Some Days    Types: Cigars   Smokeless tobacco: Not on file  Vaping Use   Vaping status: Never Used  Substance and Sexual Activity   Alcohol use: No   Drug use: No   Sexual activity: Not Currently  Other Topics Concern   Not on file  Social History Narrative   Not on file   Social Drivers of Health   Financial Resource Strain: Low Risk  (02/19/2024)   Overall Financial Resource Strain (CARDIA)    Difficulty of  Paying Living Expenses: Not hard at all  Food Insecurity: No Food Insecurity (02/19/2024)   Hunger Vital Sign    Worried About Running Out of Food in the Last Year: Never true    Ran Out of Food in the Last Year: Never true  Transportation Needs: No Transportation Needs (02/19/2024)   PRAPARE - Administrator, Civil Service (Medical): No    Lack of Transportation (Non-Medical): No  Physical Activity: Sufficiently Active (02/19/2024)   Exercise Vital Sign    Days of Exercise per Week: 6 days    Minutes of Exercise per Session: 60 min  Stress: No Stress Concern Present (02/19/2024)   [employer] of Occupational Health - Occupational Stress Questionnaire    Feeling of Stress : Only a little  Social Connections: Unknown (02/19/2024)   Social Connection and Isolation Panel [NHANES]     Frequency of Communication with Friends and Family: Patient declined    Frequency of Social Gatherings with Friends and Family: Patient declined    Attends Religious Services: Patient declined    Database administrator or Organizations: Patient declined    Attends Banker Meetings: Not on file    Marital Status: Patient declined  Intimate Partner Violence: Not At Risk (02/19/2024)   Humiliation, Afraid, Rape, and Kick questionnaire    Fear of Current or Ex-Partner: No    Emotionally Abused: No    Physically Abused: No    Sexually Abused: No    Family History  Problem Relation Age of Onset   Breast cancer Mother    HIV Father     Past Surgical History:  Procedure Laterality Date   CESAREAN SECTION  1998, 1992    x 2    ROS: Review of Systems Negative except as stated above  PHYSICAL EXAM: BP 133/88   Pulse 72   Temp 98.3 F (36.8 C) (Oral)   Resp 16   Ht 5\' 6"  (1.676 m)   Wt 144 lb 6.4 oz (65.5 kg)   LMP 12/18/2013   SpO2 98%   BMI 23.31 kg/m   Physical Exam HENT:     Head: Normocephalic and atraumatic.     Right Ear: Tympanic membrane, ear canal and external ear normal.     Left Ear: Tympanic membrane, ear canal and external ear normal.     Nose: Nose normal.     Mouth/Throat:     Mouth: Mucous membranes are moist.     Pharynx: Oropharynx is clear.  Eyes:     Extraocular Movements: Extraocular movements intact.     Conjunctiva/sclera: Conjunctivae normal.     Pupils: Pupils are equal, round, and reactive to light.  Neck:     Thyroid : No thyroid  mass, thyromegaly or thyroid  tenderness.  Cardiovascular:     Rate and Rhythm: Normal rate and regular rhythm.     Pulses: Normal pulses.     Heart sounds: Normal heart sounds.  Pulmonary:     Effort: Pulmonary effort is normal.     Breath sounds: Normal breath sounds.  Chest:     Comments: Patient declined. Abdominal:     General: Bowel sounds are normal.     Palpations: Abdomen is soft.   Genitourinary:    Comments: Patient declined. Musculoskeletal:        General: Normal range of motion.     Right shoulder: Normal.     Left shoulder: Normal.     Right upper arm: Normal.     Left  upper arm: Normal.     Right elbow: Normal.     Left elbow: Normal.     Right forearm: Normal.     Left forearm: Normal.     Right wrist: Normal.     Left wrist: Normal.     Right hand: Normal.     Left hand: Normal.     Cervical back: Normal, normal range of motion and neck supple.     Thoracic back: Normal.     Lumbar back: Normal.     Right hip: Normal.     Left hip: Normal.     Right upper leg: Normal.     Left upper leg: Normal.     Right knee: Normal.     Left knee: Normal.     Right lower leg: Normal.     Left lower leg: Normal.     Right ankle: Normal.     Left ankle: Normal.     Right foot: Normal.     Left foot: Normal.  Feet:     Right foot:     Toenail Condition: Fungal disease present.    Left foot:     Toenail Condition: Fungal disease present. Skin:    General: Skin is warm and dry.     Capillary Refill: Capillary refill takes less than 2 seconds.  Neurological:     General: No focal deficit present.     Mental Status: She is alert and oriented to person, place, and time.  Psychiatric:        Mood and Affect: Mood normal.        Behavior: Behavior normal.    ASSESSMENT AND PLAN: 1. Annual physical exam (Primary) - Counseled on 150 minutes of exercise per week as tolerated, healthy eating (including decreased daily intake of saturated fats, cholesterol, added sugars, sodium), STI prevention, and routine healthcare maintenance.  2. Screening for metabolic disorder - Routine screening.  - CMP14+EGFR  3. Screening for deficiency anemia - Routine screening.  - CBC  4. Diabetes mellitus screening - Routine screening.  - Hemoglobin A1c  5. Screening cholesterol level - Routine screening.  - Lipid panel  6. Thyroid  disorder screen - Routine  screening.  - TSH  7. Encounter for screening for HIV - Routine screening.  - HIV antibody (with reflex)  8. Routine screening for STI (sexually transmitted infection) - Routine screening.  - Cervicovaginal ancillary only  9. Need for hepatitis C screening test - Routine screening.  - Hepatitis C Antibody  10. Encounter for screening mammogram for malignant neoplasm of breast - Routine screening.  - MM Digital Screening; Future  11. Cervical cancer screening - Patient states she plans to return at later date for pap smear.   12. Colon cancer screening - Referral to Gastroenterology for colon cancer screening by colonoscopy. - Ambulatory referral to Gastroenterology  13. Onychomycosis - Referral to Podiatry for evaluation/management. - Ambulatory referral to Podiatry   Patient was given the opportunity to ask questions.  Patient verbalized understanding of the plan and was able to repeat key elements of the plan. Patient was given clear instructions to go to Emergency Department or return to medical center if symptoms don't improve, worsen, or new problems develop.The patient verbalized understanding.   Orders Placed This Encounter  Procedures   MM Digital Screening   HIV antibody (with reflex)   Hepatitis C Antibody   CBC   Lipid panel   CMP14+EGFR   Hemoglobin A1c   TSH  Ambulatory referral to Gastroenterology   Ambulatory referral to Podiatry     Return in about 1 year (around 02/18/2025) for Physical per patient preference.  Senaida Dama, NP

## 2024-02-19 NOTE — Progress Notes (Signed)
 Patient has fungus on feet, patient has a bump under her breast, patient edges came out

## 2024-02-21 ENCOUNTER — Other Ambulatory Visit: Payer: Self-pay | Admitting: Family

## 2024-02-21 ENCOUNTER — Encounter: Payer: Self-pay | Admitting: Family

## 2024-02-21 ENCOUNTER — Telehealth: Payer: Self-pay | Admitting: Emergency Medicine

## 2024-02-21 DIAGNOSIS — Z130 Encounter for screening for diseases of the blood and blood-forming organs and certain disorders involving the immune mechanism: Secondary | ICD-10-CM

## 2024-02-21 LAB — HIV ANTIBODY (ROUTINE TESTING W REFLEX): HIV Screen 4th Generation wRfx: NONREACTIVE

## 2024-02-21 LAB — CMP14+EGFR
ALT: 15 IU/L (ref 0–32)
AST: 17 IU/L (ref 0–40)
Albumin: 4.7 g/dL (ref 3.8–4.9)
Alkaline Phosphatase: 95 IU/L (ref 44–121)
BUN/Creatinine Ratio: 18 (ref 9–23)
BUN: 14 mg/dL (ref 6–24)
Bilirubin Total: 0.5 mg/dL (ref 0.0–1.2)
CO2: 21 mmol/L (ref 20–29)
Calcium: 9.7 mg/dL (ref 8.7–10.2)
Chloride: 104 mmol/L (ref 96–106)
Creatinine, Ser: 0.8 mg/dL (ref 0.57–1.00)
Globulin, Total: 2.6 g/dL (ref 1.5–4.5)
Glucose: 79 mg/dL (ref 70–99)
Potassium: 4.2 mmol/L (ref 3.5–5.2)
Sodium: 143 mmol/L (ref 134–144)
Total Protein: 7.3 g/dL (ref 6.0–8.5)
eGFR: 87 mL/min/{1.73_m2} (ref >59)

## 2024-02-21 LAB — CBC
Hematocrit: 42 % (ref 34.0–46.6)
Hemoglobin: 13.1 g/dL (ref 11.1–15.9)
MCH: 22.3 pg — ABNORMAL LOW (ref 26.6–33.0)
MCHC: 31.2 g/dL — ABNORMAL LOW (ref 31.5–35.7)
MCV: 71 fL — ABNORMAL LOW (ref 79–97)
Platelets: 258 10*3/uL (ref 150–450)
RBC: 5.88 x10E6/uL — ABNORMAL HIGH (ref 3.77–5.28)
RDW: 15.7 % — ABNORMAL HIGH (ref 11.7–15.4)
WBC: 5.7 10*3/uL (ref 3.4–10.8)

## 2024-02-21 LAB — LIPID PANEL
Chol/HDL Ratio: 3.1 ratio (ref 0.0–4.4)
Cholesterol, Total: 170 mg/dL (ref 100–199)
HDL: 54 mg/dL (ref >39)
LDL Chol Calc (NIH): 97 mg/dL (ref 0–99)
Triglycerides: 104 mg/dL (ref 0–149)
VLDL Cholesterol Cal: 19 mg/dL (ref 5–40)

## 2024-02-21 LAB — HEMOGLOBIN A1C
Est. average glucose Bld gHb Est-mCnc: 100 mg/dL
Hgb A1c MFr Bld: 5.1 % (ref 4.8–5.6)

## 2024-02-21 LAB — TSH: TSH: 1.22 u[IU]/mL (ref 0.450–4.500)

## 2024-02-21 LAB — HEPATITIS C ANTIBODY: Hep C Virus Ab: NONREACTIVE

## 2024-02-21 NOTE — Telephone Encounter (Signed)
 Patient did not do cervical swab when she was here and was supposed to come back yesterday however she did not.  I call her again today and she stated "she would be by after she leaves work today.

## 2024-02-25 LAB — CERVICOVAGINAL ANCILLARY ONLY
Bacterial Vaginitis (gardnerella): NEGATIVE
Candida Glabrata: NEGATIVE
Candida Vaginitis: NEGATIVE
Chlamydia: NEGATIVE
Comment: NEGATIVE
Comment: NEGATIVE
Comment: NEGATIVE
Comment: NEGATIVE
Comment: NEGATIVE
Comment: NORMAL
Neisseria Gonorrhea: NEGATIVE
Trichomonas: NEGATIVE

## 2024-03-24 ENCOUNTER — Encounter: Payer: Self-pay | Admitting: Family

## 2024-03-24 ENCOUNTER — Other Ambulatory Visit (HOSPITAL_COMMUNITY)
Admission: RE | Admit: 2024-03-24 | Discharge: 2024-03-24 | Disposition: A | Discharge status: Home / Self Care | Source: Ambulatory Visit | Attending: Family | Admitting: Family

## 2024-03-24 ENCOUNTER — Ambulatory Visit: Admitting: Family

## 2024-03-24 VITALS — BP 154/88 | HR 60 | Temp 99.2°F | Resp 16 | Ht 66.0 in | Wt 144.4 lb

## 2024-03-24 DIAGNOSIS — Z124 Encounter for screening for malignant neoplasm of cervix: Secondary | ICD-10-CM | POA: Insufficient documentation

## 2024-03-24 DIAGNOSIS — Z113 Encounter for screening for infections with a predominantly sexual mode of transmission: Secondary | ICD-10-CM | POA: Diagnosis not present

## 2024-03-24 DIAGNOSIS — D649 Anemia, unspecified: Secondary | ICD-10-CM | POA: Diagnosis not present

## 2024-03-24 DIAGNOSIS — R030 Elevated blood-pressure reading, without diagnosis of hypertension: Secondary | ICD-10-CM

## 2024-03-24 NOTE — Progress Notes (Unsigned)
 Patient ID: Laura Todd, female    DOB: 1971-03-21  MRN: [id number]  CC: Pap Smear   Subjective: Laura Todd is a 53 y.o. female who presents for pap smear.   Her concerns today include: ***  There are no active problems to display for this patient.    Current Outpatient Medications on File Prior to Visit  Medication Sig Dispense Refill   amoxicillin  (AMOXIL ) 500 MG capsule Take 1 capsule (500 mg total) by mouth 3 (three) times daily until gone (Patient not taking: Reported on 02/19/2024) 21 capsule 0   Biotin 1000 MCG CHEW Chew by mouth.     DULoxetine  (CYMBALTA ) 20 MG capsule Take 1 capsule (20 mg total) by mouth daily. (Patient not taking: Reported on 02/19/2024) 30 capsule 1   Ferrous Fumarate (HEMOCYTE - 106 MG FE) 324 (106 Fe) MG TABS tablet Take 1 tablet by mouth.     ferrous gluconate  (FERGON) 240 (27 FE) MG tablet Take 1 tablet (240 mg total) by mouth daily before meals (Patient not taking: Reported on 02/19/2024) 90 tablet 1   ibuprofen  (ADVIL ) 800 MG tablet Take 1 tablet (800 mg total) by mouth every 6 (six) hours as needed for pain (Patient not taking: Reported on 02/19/2024) 20 tablet 2   ibuprofen  (ADVIL ,MOTRIN ) 200 MG tablet Take 200 mg by mouth every 6 (six) hours as needed for mild pain or moderate pain. (Patient not taking: Reported on 02/19/2024)     losartan  (COZAAR ) 25 MG tablet Take 1 tablet (25 mg total) by mouth daily. 30 tablet 1   magnesium gluconate (MAGONATE) 500 (27 Mg) MG TABS tablet Take 500 mg by mouth daily.     methocarbamol  (ROBAXIN -750) 750 MG tablet Take 1 tablet (750 mg total) by mouth 2 (two) times daily as needed for muscle spasms. 20 tablet 0   Multiple Vitamins-Minerals (MULTIVITAMIN WITH MINERALS) tablet Take 1 tablet by mouth daily.     omega-3 acid ethyl esters (LOVAZA) 1 g capsule Take by mouth 2 (two) times daily.     predniSONE  (STERAPRED UNI-PAK 21 TAB) 10 MG (21) TBPK tablet Take as directed (Patient not taking: Reported on 02/19/2024) 21  tablet 0   tolterodine  (DETROL  LA) 4 MG 24 hr capsule Take 1 capsule (4 mg total) by mouth daily. 30 capsule 1   No current facility-administered medications on file prior to visit.    No Known Allergies  Social History   Socioeconomic History   Marital status: Single    Spouse name: Not on file   Number of children: Not on file   Years of education: Not on file   Highest education level: Not on file  Occupational History   Not on file  Tobacco Use   Smoking status: Some Days    Types: Cigars   Smokeless tobacco: Not on file  Vaping Use   Vaping status: Never Used  Substance and Sexual Activity   Alcohol use: No   Drug use: No   Sexual activity: Not Currently  Other Topics Concern   Not on file  Social History Narrative   Not on file   Social Drivers of Health   Financial Resource Strain: Low Risk  (02/19/2024)   Overall Financial Resource Strain (CARDIA)    Difficulty of Paying Living Expenses: Not hard at all  Food Insecurity: No Food Insecurity (02/19/2024)   Hunger Vital Sign    Worried About Running Out of Food in the Last Year: Never true  Ran Out of Food in the Last Year: Never true  Transportation Needs: No Transportation Needs (02/19/2024)   PRAPARE - Administrator, Civil Service (Medical): No    Lack of Transportation (Non-Medical): No  Physical Activity: Sufficiently Active (02/19/2024)   Exercise Vital Sign    Days of Exercise per Week: 6 days    Minutes of Exercise per Session: 60 min  Stress: No Stress Concern Present (02/19/2024)   [employer] of Occupational Health - Occupational Stress Questionnaire    Feeling of Stress : Only a little  Social Connections: Unknown (02/19/2024)   Social Connection and Isolation Panel [NHANES]    Frequency of Communication with Friends and Family: Patient declined    Frequency of Social Gatherings with Friends and Family: Patient declined    Attends Religious Services: Patient declined    Active  Member of Clubs or Organizations: Patient declined    Attends Banker Meetings: Not on file    Marital Status: Patient declined  Intimate Partner Violence: Not At Risk (02/19/2024)   Humiliation, Afraid, Rape, and Kick questionnaire    Fear of Current or Ex-Partner: No    Emotionally Abused: No    Physically Abused: No    Sexually Abused: No    Family History  Problem Relation Age of Onset   Breast cancer Mother    HIV Father     Past Surgical History:  Procedure Laterality Date   CESAREAN SECTION  1998, 1992    x 2    ROS: Review of Systems Negative except as stated above  PHYSICAL EXAM: LMP 12/18/2013   Physical Exam  {female adult master:310786} {female adult master:310785}     Latest Ref Rng & Units 02/19/2024    1:54 PM 10/29/2014    9:05 AM 04/22/2012    8:24 PM  CMP  Glucose 70 - 99 mg/dL 79  95  92   BUN 6 - 24 mg/dL 14  11  9    Creatinine 0.57 - 1.00 mg/dL 1.61  0.96  0.45   Sodium 134 - 144 mmol/L 143  137  140   Potassium 3.5 - 5.2 mmol/L 4.2  3.9  3.6   Chloride 96 - 106 mmol/L 104  105  101   CO2 20 - 29 mmol/L 21  25    Calcium 8.7 - 10.2 mg/dL 9.7  9.0    Total Protein 6.0 - 8.5 g/dL 7.3     Total Bilirubin 0.0 - 1.2 mg/dL 0.5     Alkaline Phos 44 - 121 IU/L 95     AST 0 - 40 IU/L 17     ALT 0 - 32 IU/L 15      Lipid Panel     Component Value Date/Time   CHOL 170 02/19/2024 1354   TRIG 104 02/19/2024 1354   HDL 54 02/19/2024 1354   CHOLHDL 3.1 02/19/2024 1354   LDLCALC 97 02/19/2024 1354    CBC    Component Value Date/Time   WBC 5.7 02/19/2024 1354   WBC 8.0 10/29/2014 0905   RBC 5.88 (H) 02/19/2024 1354   RBC 5.41 (H) 10/29/2014 0905   HGB 13.1 02/19/2024 1354   HCT 42.0 02/19/2024 1354   PLT 258 02/19/2024 1354   MCV 71 (L) 02/19/2024 1354   MCH 22.3 (L) 02/19/2024 1354   MCH 22.4 (L) 10/29/2014 0905   MCHC 31.2 (L) 02/19/2024 1354   MCHC 32.7 10/29/2014 0905   RDW 15.7 (H) 02/19/2024 1354  LYMPHSABS 1.5  02/15/2011 1122   MONOABS 0.5 02/15/2011 1122   EOSABS 0.2 02/15/2011 1122   BASOSABS 0.0 02/15/2011 1122    ASSESSMENT AND PLAN:  There are no diagnoses linked to this encounter.   Patient was given the opportunity to ask questions.  Patient verbalized understanding of the plan and was able to repeat key elements of the plan. Patient was given clear instructions to go to Emergency Department or return to medical center if symptoms don't improve, worsen, or new problems develop.The patient verbalized understanding.   No orders of the defined types were placed in this encounter.    Requested Prescriptions    No prescriptions requested or ordered in this encounter    No follow-ups on file.  Senaida Dama, NP

## 2024-03-24 NOTE — Progress Notes (Unsigned)
 No concerns.

## 2024-03-25 ENCOUNTER — Ambulatory Visit: Payer: Self-pay | Admitting: Family

## 2024-03-25 LAB — CERVICOVAGINAL ANCILLARY ONLY
Bacterial Vaginitis (gardnerella): NEGATIVE
Candida Glabrata: NEGATIVE
Candida Vaginitis: NEGATIVE
Chlamydia: NEGATIVE
Comment: NEGATIVE
Comment: NEGATIVE
Comment: NEGATIVE
Comment: NEGATIVE
Comment: NEGATIVE
Comment: NORMAL
Neisseria Gonorrhea: NEGATIVE
Trichomonas: NEGATIVE

## 2024-03-26 LAB — CYTOLOGY - PAP
Adequacy: ABSENT
Comment: NEGATIVE
Diagnosis: NEGATIVE
High risk HPV: NEGATIVE

## 2024-05-04 ENCOUNTER — Ambulatory Visit: Admitting: Podiatry

## 2024-05-04 ENCOUNTER — Encounter: Payer: Self-pay | Admitting: Podiatry

## 2024-05-04 DIAGNOSIS — B351 Tinea unguium: Secondary | ICD-10-CM | POA: Diagnosis not present

## 2024-05-04 NOTE — Addendum Note (Signed)
 Addended by: GERRIT ANDREZ CROME on: 05/04/2024 04:13 PM   Modules accepted: Orders

## 2024-05-04 NOTE — Progress Notes (Signed)
  Subjective:  Patient ID: Laura Todd, female    DOB: October 13, 1971,   MRN: [id number]  Chief Complaint  Patient presents with   Nail Problem    Onychomycosis bilateral. Non daibetic. 0 pain    53 y.o. female presents for concern of bilateral toenails changes. Relates for a while now she has noticed her fifth toes have been changing and wanted to evaluate for fungus. Denies any current treatments. No pain. She is not diabetic.   Laura Todd Denies any other pedal complaints. Denies n/v/f/c.   History reviewed. No pertinent past medical history.  Objective:  Physical Exam: Vascular: DP/PT pulses 2/4 bilateral. CFT <3 seconds. Normal hair growth on digits. No edema.  Skin. No lacerations or abrasions bilateral feet. Bilateral fifth digits are thickened and dystrophic with subungual debris. Also noted to distal left second digit as well.  Musculoskeletal: MMT 5/5 bilateral lower extremities in DF, PF, Inversion and Eversion. Deceased ROM in DF of ankle joint.  Neurological: Sensation intact to light touch.   Assessment:   1. Onychomycosis      Plan:  Patient was evaluated and treated and all questions answered. -Examined patient -Discussed treatment options for painful dystrophic nails  -Clinical picture and Fungal culture was obtained by removing a portion of the hard nail itself from each of the involved toenails using a sterile nail nipper and sent to [facility] lab. Patient tolerated the biopsy procedure well without discomfort or need for anesthesia.  -Discussed fungal nail treatment options including oral, topical, and laser treatments.  -Patient to return in 4 weeks for follow up evaluation and discussion of fungal culture results or sooner if symptoms worsen.   Asberry Failing, DPM

## 2024-05-25 ENCOUNTER — Ambulatory Visit
Admission: RE | Admit: 2024-05-25 | Discharge: 2024-05-25 | Disposition: A | Discharge status: Home / Self Care | Source: Ambulatory Visit | Attending: Family | Admitting: Family

## 2024-05-25 DIAGNOSIS — Z1231 Encounter for screening mammogram for malignant neoplasm of breast: Secondary | ICD-10-CM | POA: Diagnosis not present

## 2024-05-27 ENCOUNTER — Ambulatory Visit: Payer: Self-pay | Admitting: Family

## 2024-05-28 ENCOUNTER — Other Ambulatory Visit: Payer: Self-pay | Admitting: Family

## 2024-05-28 DIAGNOSIS — R928 Other abnormal and inconclusive findings on diagnostic imaging of breast: Secondary | ICD-10-CM

## 2024-06-01 ENCOUNTER — Telehealth: Payer: Self-pay | Admitting: Family

## 2024-06-01 NOTE — Telephone Encounter (Signed)
 A document form from [facility] has been faxed: STAT , to be filled out by provider. Send document back via Fax within 2-days. Document is located in providers tray at front office.          Fax number: [phone]

## 2024-06-03 ENCOUNTER — Ambulatory Visit
Admission: RE | Admit: 2024-06-03 | Discharge: 2024-06-03 | Disposition: A | Discharge status: Home / Self Care | Source: Ambulatory Visit | Attending: Family | Admitting: Family

## 2024-06-03 ENCOUNTER — Ambulatory Visit: Payer: Self-pay | Admitting: Family

## 2024-06-03 DIAGNOSIS — R928 Other abnormal and inconclusive findings on diagnostic imaging of breast: Secondary | ICD-10-CM

## 2024-06-03 DIAGNOSIS — R92322 Mammographic fibroglandular density, left breast: Secondary | ICD-10-CM | POA: Diagnosis not present

## 2024-06-03 DIAGNOSIS — N6489 Other specified disorders of breast: Secondary | ICD-10-CM | POA: Diagnosis not present

## 2024-06-08 NOTE — Telephone Encounter (Signed)
 I faxed paperwork back on 06/01/2024

## 2024-06-10 ENCOUNTER — Ambulatory Visit: Admitting: Podiatry

## 2024-06-10 DIAGNOSIS — Z91199 Patient's noncompliance with other medical treatment and regimen due to unspecified reason: Secondary | ICD-10-CM

## 2024-06-10 NOTE — Progress Notes (Signed)
 No show

## 2024-08-22 ENCOUNTER — Telehealth: Payer: Self-pay | Admitting: Internal Medicine

## 2024-08-22 MED ORDER — AMOXICILLIN-POT CLAVULANATE 875-125 MG PO TABS: 1.0000 | ORAL_TABLET | Freq: Two times a day (BID) | ORAL | 0 refills | Status: AC

## 2024-08-22 NOTE — Telephone Encounter (Signed)
 Maxillary sinusitis x 1 week, TTP R sinus Rx 7 days augmentin, RTC if no improvement

## 2025-02-19 ENCOUNTER — Encounter: Admitting: Family
